# Patient Record
Sex: Female | Born: 1935 | Race: White | Hispanic: No | Smoking: Former smoker
Health system: Southern US, Community
[De-identification: ages and names within clinical notes are randomized; demographics above are authoritative.]

## PROBLEM LIST (undated history)

## (undated) DIAGNOSIS — I951 Orthostatic hypotension: Secondary | ICD-10-CM

## (undated) DIAGNOSIS — Z9889 Other specified postprocedural states: Secondary | ICD-10-CM

## (undated) DIAGNOSIS — Z860100 Personal history of colon polyps, unspecified: Secondary | ICD-10-CM

## (undated) DIAGNOSIS — M1990 Unspecified osteoarthritis, unspecified site: Secondary | ICD-10-CM

## (undated) DIAGNOSIS — F439 Reaction to severe stress, unspecified: Secondary | ICD-10-CM

## (undated) DIAGNOSIS — I340 Nonrheumatic mitral (valve) insufficiency: Secondary | ICD-10-CM

## (undated) DIAGNOSIS — H10439 Chronic follicular conjunctivitis, unspecified eye: Secondary | ICD-10-CM

## (undated) DIAGNOSIS — K648 Other hemorrhoids: Secondary | ICD-10-CM

## (undated) DIAGNOSIS — R9430 Abnormal result of cardiovascular function study, unspecified: Secondary | ICD-10-CM

## (undated) DIAGNOSIS — I6529 Occlusion and stenosis of unspecified carotid artery: Secondary | ICD-10-CM

## (undated) DIAGNOSIS — K225 Diverticulum of esophagus, acquired: Secondary | ICD-10-CM

## (undated) DIAGNOSIS — H906 Mixed conductive and sensorineural hearing loss, bilateral: Secondary | ICD-10-CM

## (undated) DIAGNOSIS — N289 Disorder of kidney and ureter, unspecified: Secondary | ICD-10-CM

## (undated) DIAGNOSIS — K5732 Diverticulitis of large intestine without perforation or abscess without bleeding: Secondary | ICD-10-CM

## (undated) DIAGNOSIS — C4490 Unspecified malignant neoplasm of skin, unspecified: Secondary | ICD-10-CM

## (undated) DIAGNOSIS — I2510 Atherosclerotic heart disease of native coronary artery without angina pectoris: Secondary | ICD-10-CM

## (undated) DIAGNOSIS — G459 Transient cerebral ischemic attack, unspecified: Secondary | ICD-10-CM

## (undated) DIAGNOSIS — I071 Rheumatic tricuspid insufficiency: Secondary | ICD-10-CM

## (undated) DIAGNOSIS — I4891 Unspecified atrial fibrillation: Secondary | ICD-10-CM

## (undated) DIAGNOSIS — IMO0002 Reserved for concepts with insufficient information to code with codable children: Secondary | ICD-10-CM

## (undated) DIAGNOSIS — I878 Other specified disorders of veins: Secondary | ICD-10-CM

## (undated) DIAGNOSIS — E8770 Fluid overload, unspecified: Secondary | ICD-10-CM

## (undated) DIAGNOSIS — T07XXXA Unspecified multiple injuries, initial encounter: Secondary | ICD-10-CM

## (undated) DIAGNOSIS — M79606 Pain in leg, unspecified: Secondary | ICD-10-CM

## (undated) DIAGNOSIS — I272 Pulmonary hypertension, unspecified: Secondary | ICD-10-CM

## (undated) DIAGNOSIS — J449 Chronic obstructive pulmonary disease, unspecified: Secondary | ICD-10-CM

## (undated) DIAGNOSIS — M797 Fibromyalgia: Secondary | ICD-10-CM

## (undated) DIAGNOSIS — Z7901 Long term (current) use of anticoagulants: Secondary | ICD-10-CM

## (undated) DIAGNOSIS — R490 Dysphonia: Secondary | ICD-10-CM

## (undated) DIAGNOSIS — L817 Pigmented purpuric dermatosis: Secondary | ICD-10-CM

## (undated) DIAGNOSIS — R42 Dizziness and giddiness: Secondary | ICD-10-CM

## (undated) DIAGNOSIS — M722 Plantar fascial fibromatosis: Secondary | ICD-10-CM

## (undated) HISTORY — PX: OTHER SURGICAL HISTORY: SHX169

## (undated) HISTORY — DX: Fibromyalgia: M79.7

## (undated) HISTORY — DX: Atherosclerotic heart disease of native coronary artery without angina pectoris: I25.10

## (undated) HISTORY — DX: Diverticulitis of large intestine without perforation or abscess without bleeding: K57.32

## (undated) HISTORY — DX: Nonrheumatic mitral (valve) insufficiency: I34.0

## (undated) HISTORY — DX: Plantar fascial fibromatosis: M72.2

## (undated) HISTORY — DX: Mixed conductive and sensorineural hearing loss, bilateral: H90.6

## (undated) HISTORY — DX: Transient cerebral ischemic attack, unspecified: G45.9

## (undated) HISTORY — PX: SHOULDER SURGERY: SHX246

## (undated) HISTORY — DX: Chronic follicular conjunctivitis, unspecified eye: H10.439

## (undated) HISTORY — DX: Dysphonia: R49.0

## (undated) HISTORY — DX: Unspecified multiple injuries, initial encounter: T07.XXXA

## (undated) HISTORY — PX: INTRAOCULAR LENS INSERTION: SHX110

## (undated) HISTORY — DX: Rheumatic tricuspid insufficiency: I07.1

## (undated) HISTORY — DX: Personal history of colonic polyps: Z86.010

## (undated) HISTORY — DX: Occlusion and stenosis of unspecified carotid artery: I65.29

## (undated) HISTORY — DX: Unspecified atrial fibrillation: I48.91

## (undated) HISTORY — DX: Long term (current) use of anticoagulants: Z79.01

## (undated) HISTORY — DX: Reaction to severe stress, unspecified: F43.9

## (undated) HISTORY — DX: Dizziness and giddiness: R42

## (undated) HISTORY — PX: INGUINAL HERNIA REPAIR: SHX194

## (undated) HISTORY — DX: Other hemorrhoids: K64.8

## (undated) HISTORY — DX: Other specified postprocedural states: Z98.890

## (undated) HISTORY — DX: Pain in leg, unspecified: M79.606

## (undated) HISTORY — DX: Pigmented purpuric dermatosis: L81.7

## (undated) HISTORY — DX: Orthostatic hypotension: I95.1

## (undated) HISTORY — PX: HEMORRHOID SURGERY: SHX153

## (undated) HISTORY — DX: Abnormal result of cardiovascular function study, unspecified: R94.30

## (undated) HISTORY — PX: FOOT SURGERY: SHX648

## (undated) HISTORY — DX: Personal history of colon polyps, unspecified: Z86.0100

## (undated) HISTORY — PX: TONSILLECTOMY: SUR1361

## (undated) HISTORY — DX: Reserved for concepts with insufficient information to code with codable children: IMO0002

## (undated) HISTORY — DX: Unspecified malignant neoplasm of skin, unspecified: C44.90

## (undated) HISTORY — DX: Diverticulum of esophagus, acquired: K22.5

## (undated) HISTORY — DX: Fluid overload, unspecified: E87.70

---

## 1998-03-09 ENCOUNTER — Ambulatory Visit (HOSPITAL_COMMUNITY): Admission: RE | Admit: 1998-03-09 | Discharge: 1998-03-09 | Payer: Self-pay | Admitting: Gastroenterology

## 1998-03-09 ENCOUNTER — Encounter: Payer: Self-pay | Admitting: Gastroenterology

## 1998-06-19 ENCOUNTER — Other Ambulatory Visit: Admission: RE | Admit: 1998-06-19 | Discharge: 1998-06-19 | Payer: Self-pay | Admitting: Obstetrics and Gynecology

## 1998-11-17 ENCOUNTER — Encounter: Payer: Self-pay | Admitting: Gastroenterology

## 1998-11-17 ENCOUNTER — Ambulatory Visit (HOSPITAL_COMMUNITY): Admission: RE | Admit: 1998-11-17 | Discharge: 1998-11-17 | Payer: Self-pay | Admitting: Gastroenterology

## 1999-03-02 ENCOUNTER — Ambulatory Visit (HOSPITAL_COMMUNITY): Admission: RE | Admit: 1999-03-02 | Discharge: 1999-03-02 | Payer: Self-pay | Admitting: Cardiology

## 1999-05-04 ENCOUNTER — Inpatient Hospital Stay (HOSPITAL_COMMUNITY): Admission: AD | Admit: 1999-05-04 | Discharge: 1999-05-08 | Payer: Self-pay | Admitting: Cardiology

## 1999-05-28 ENCOUNTER — Encounter: Admission: RE | Admit: 1999-05-28 | Discharge: 1999-05-28 | Payer: Self-pay | Admitting: Unknown Physician Specialty

## 1999-06-05 ENCOUNTER — Other Ambulatory Visit: Admission: RE | Admit: 1999-06-05 | Discharge: 1999-06-05 | Payer: Self-pay | Admitting: Obstetrics and Gynecology

## 1999-06-25 ENCOUNTER — Encounter: Admission: RE | Admit: 1999-06-25 | Discharge: 1999-06-25 | Payer: Self-pay | Admitting: Obstetrics and Gynecology

## 1999-06-25 ENCOUNTER — Encounter: Payer: Self-pay | Admitting: Obstetrics and Gynecology

## 2000-02-21 ENCOUNTER — Encounter: Payer: Self-pay | Admitting: Gastroenterology

## 2000-02-21 ENCOUNTER — Ambulatory Visit (HOSPITAL_COMMUNITY): Admission: RE | Admit: 2000-02-21 | Discharge: 2000-02-21 | Payer: Self-pay | Admitting: Gastroenterology

## 2000-06-26 ENCOUNTER — Encounter: Admission: RE | Admit: 2000-06-26 | Discharge: 2000-06-26 | Payer: Self-pay | Admitting: Obstetrics and Gynecology

## 2000-06-26 ENCOUNTER — Encounter: Payer: Self-pay | Admitting: Obstetrics and Gynecology

## 2001-02-13 ENCOUNTER — Other Ambulatory Visit: Admission: RE | Admit: 2001-02-13 | Discharge: 2001-02-13 | Payer: Self-pay | Admitting: Obstetrics and Gynecology

## 2001-06-12 ENCOUNTER — Ambulatory Visit (HOSPITAL_COMMUNITY): Admission: RE | Admit: 2001-06-12 | Discharge: 2001-06-12 | Payer: Self-pay

## 2002-01-04 ENCOUNTER — Encounter (INDEPENDENT_AMBULATORY_CARE_PROVIDER_SITE_OTHER): Payer: Self-pay | Admitting: Specialist

## 2002-01-04 ENCOUNTER — Encounter: Payer: Self-pay | Admitting: Gastroenterology

## 2002-01-04 ENCOUNTER — Encounter (INDEPENDENT_AMBULATORY_CARE_PROVIDER_SITE_OTHER): Payer: Self-pay | Admitting: *Deleted

## 2002-01-04 ENCOUNTER — Ambulatory Visit (HOSPITAL_COMMUNITY): Admission: RE | Admit: 2002-01-04 | Discharge: 2002-01-04 | Payer: Self-pay | Admitting: Gastroenterology

## 2002-03-18 ENCOUNTER — Encounter: Admission: RE | Admit: 2002-03-18 | Discharge: 2002-03-18 | Payer: Self-pay

## 2003-01-24 ENCOUNTER — Encounter: Payer: Self-pay | Admitting: Dentistry

## 2003-01-24 ENCOUNTER — Inpatient Hospital Stay (HOSPITAL_COMMUNITY): Admission: EM | Admit: 2003-01-24 | Discharge: 2003-01-26 | Payer: Self-pay | Admitting: Emergency Medicine

## 2003-01-25 ENCOUNTER — Encounter: Payer: Self-pay | Admitting: *Deleted

## 2003-03-07 ENCOUNTER — Ambulatory Visit (HOSPITAL_COMMUNITY): Admission: RE | Admit: 2003-03-07 | Discharge: 2003-03-07 | Payer: Self-pay | Admitting: Gastroenterology

## 2003-03-07 ENCOUNTER — Encounter: Payer: Self-pay | Admitting: Gastroenterology

## 2003-07-09 DIAGNOSIS — T07XXXA Unspecified multiple injuries, initial encounter: Secondary | ICD-10-CM

## 2003-07-09 HISTORY — DX: Unspecified multiple injuries, initial encounter: T07.XXXA

## 2003-08-17 ENCOUNTER — Emergency Department (HOSPITAL_COMMUNITY): Admission: EM | Admit: 2003-08-17 | Discharge: 2003-08-17 | Payer: Self-pay | Admitting: Family Medicine

## 2004-01-27 ENCOUNTER — Encounter: Admission: RE | Admit: 2004-01-27 | Discharge: 2004-01-27 | Payer: Self-pay | Admitting: Internal Medicine

## 2004-02-23 ENCOUNTER — Other Ambulatory Visit: Admission: RE | Admit: 2004-02-23 | Discharge: 2004-02-23 | Payer: Self-pay | Admitting: Obstetrics and Gynecology

## 2004-03-01 ENCOUNTER — Inpatient Hospital Stay (HOSPITAL_COMMUNITY): Admission: EM | Admit: 2004-03-01 | Discharge: 2004-03-04 | Payer: Self-pay | Admitting: Emergency Medicine

## 2004-05-23 ENCOUNTER — Ambulatory Visit: Payer: Self-pay | Admitting: *Deleted

## 2004-06-04 ENCOUNTER — Ambulatory Visit: Payer: Self-pay | Admitting: Internal Medicine

## 2004-06-25 ENCOUNTER — Ambulatory Visit: Payer: Self-pay | Admitting: Cardiovascular Disease

## 2004-07-03 ENCOUNTER — Ambulatory Visit: Payer: Self-pay

## 2004-07-24 ENCOUNTER — Ambulatory Visit: Payer: Self-pay

## 2004-08-01 ENCOUNTER — Ambulatory Visit: Payer: Self-pay | Admitting: Cardiology

## 2004-08-15 ENCOUNTER — Ambulatory Visit: Payer: Self-pay | Admitting: Internal Medicine

## 2004-09-05 ENCOUNTER — Ambulatory Visit: Payer: Self-pay | Admitting: Cardiology

## 2004-09-19 ENCOUNTER — Ambulatory Visit: Payer: Self-pay | Admitting: *Deleted

## 2004-10-01 ENCOUNTER — Ambulatory Visit: Payer: Self-pay | Admitting: Cardiology

## 2004-10-02 ENCOUNTER — Ambulatory Visit: Payer: Self-pay | Admitting: Cardiology

## 2004-10-17 ENCOUNTER — Ambulatory Visit: Payer: Self-pay | Admitting: Internal Medicine

## 2004-11-14 ENCOUNTER — Ambulatory Visit: Payer: Self-pay | Admitting: Cardiology

## 2004-11-26 ENCOUNTER — Emergency Department (HOSPITAL_COMMUNITY): Admission: EM | Admit: 2004-11-26 | Discharge: 2004-11-26 | Payer: Self-pay | Admitting: Emergency Medicine

## 2004-12-14 ENCOUNTER — Ambulatory Visit (HOSPITAL_COMMUNITY): Admission: RE | Admit: 2004-12-14 | Discharge: 2004-12-14 | Payer: Self-pay | Admitting: Specialist

## 2004-12-21 ENCOUNTER — Ambulatory Visit: Payer: Self-pay | Admitting: Cardiology

## 2005-01-10 ENCOUNTER — Ambulatory Visit: Payer: Self-pay | Admitting: Internal Medicine

## 2005-02-06 ENCOUNTER — Ambulatory Visit: Payer: Self-pay | Admitting: *Deleted

## 2005-03-07 ENCOUNTER — Ambulatory Visit: Payer: Self-pay | Admitting: Internal Medicine

## 2005-03-22 ENCOUNTER — Ambulatory Visit: Payer: Self-pay | Admitting: Internal Medicine

## 2005-04-03 ENCOUNTER — Ambulatory Visit: Payer: Self-pay | Admitting: Cardiology

## 2005-04-25 ENCOUNTER — Ambulatory Visit: Payer: Self-pay | Admitting: Cardiology

## 2005-05-01 ENCOUNTER — Ambulatory Visit: Payer: Self-pay | Admitting: Cardiology

## 2005-05-07 ENCOUNTER — Ambulatory Visit: Payer: Self-pay | Admitting: Cardiology

## 2005-05-21 ENCOUNTER — Ambulatory Visit: Payer: Self-pay | Admitting: Internal Medicine

## 2005-06-05 ENCOUNTER — Ambulatory Visit: Payer: Self-pay | Admitting: Cardiology

## 2005-06-19 ENCOUNTER — Ambulatory Visit: Payer: Self-pay | Admitting: Cardiology

## 2005-07-10 ENCOUNTER — Ambulatory Visit: Payer: Self-pay | Admitting: Cardiology

## 2005-08-07 ENCOUNTER — Ambulatory Visit: Payer: Self-pay | Admitting: Cardiology

## 2005-08-28 ENCOUNTER — Ambulatory Visit: Payer: Self-pay | Admitting: Cardiology

## 2005-09-24 ENCOUNTER — Encounter: Payer: Self-pay | Admitting: Internal Medicine

## 2005-09-24 ENCOUNTER — Ambulatory Visit: Payer: Self-pay | Admitting: Cardiology

## 2005-09-24 ENCOUNTER — Ambulatory Visit: Payer: Self-pay | Admitting: Internal Medicine

## 2005-10-22 ENCOUNTER — Ambulatory Visit: Payer: Self-pay | Admitting: Cardiology

## 2005-11-06 ENCOUNTER — Ambulatory Visit: Payer: Self-pay | Admitting: Cardiology

## 2005-11-20 ENCOUNTER — Ambulatory Visit: Payer: Self-pay | Admitting: Cardiology

## 2005-12-18 ENCOUNTER — Ambulatory Visit: Payer: Self-pay | Admitting: Cardiovascular Disease

## 2006-01-13 ENCOUNTER — Ambulatory Visit: Payer: Self-pay | Admitting: Internal Medicine

## 2006-01-13 ENCOUNTER — Ambulatory Visit: Payer: Self-pay

## 2006-01-29 ENCOUNTER — Ambulatory Visit: Payer: Self-pay | Admitting: Cardiology

## 2006-02-25 ENCOUNTER — Ambulatory Visit: Payer: Self-pay | Admitting: Cardiology

## 2006-03-26 ENCOUNTER — Ambulatory Visit: Payer: Self-pay | Admitting: Cardiology

## 2006-03-28 ENCOUNTER — Encounter: Payer: Self-pay | Admitting: Internal Medicine

## 2006-04-10 ENCOUNTER — Ambulatory Visit: Payer: Self-pay | Admitting: Cardiology

## 2006-04-23 ENCOUNTER — Ambulatory Visit: Payer: Self-pay | Admitting: *Deleted

## 2006-04-23 ENCOUNTER — Ambulatory Visit: Payer: Self-pay | Admitting: Cardiology

## 2006-05-07 ENCOUNTER — Ambulatory Visit: Payer: Self-pay | Admitting: Cardiology

## 2006-05-22 ENCOUNTER — Ambulatory Visit: Payer: Self-pay | Admitting: Cardiology

## 2006-05-28 ENCOUNTER — Encounter: Payer: Self-pay | Admitting: Internal Medicine

## 2006-06-11 ENCOUNTER — Ambulatory Visit: Payer: Self-pay | Admitting: Cardiology

## 2006-07-09 ENCOUNTER — Ambulatory Visit: Payer: Self-pay | Admitting: *Deleted

## 2006-07-28 ENCOUNTER — Encounter: Payer: Self-pay | Admitting: Internal Medicine

## 2006-07-30 ENCOUNTER — Ambulatory Visit: Payer: Self-pay | Admitting: Cardiology

## 2006-08-15 ENCOUNTER — Ambulatory Visit: Payer: Self-pay | Admitting: *Deleted

## 2006-08-15 ENCOUNTER — Other Ambulatory Visit: Admission: RE | Admit: 2006-08-15 | Discharge: 2006-08-15 | Payer: Self-pay | Admitting: Obstetrics & Gynecology

## 2006-09-03 ENCOUNTER — Encounter: Admission: RE | Admit: 2006-09-03 | Discharge: 2006-09-03 | Payer: Self-pay | Admitting: Obstetrics & Gynecology

## 2006-09-03 ENCOUNTER — Ambulatory Visit: Payer: Self-pay | Admitting: Internal Medicine

## 2006-10-01 ENCOUNTER — Ambulatory Visit: Payer: Self-pay | Admitting: Cardiovascular Disease

## 2006-10-23 ENCOUNTER — Ambulatory Visit: Payer: Self-pay | Admitting: Internal Medicine

## 2006-10-23 LAB — CONVERTED CEMR LAB
ALT: 15 units/L (ref 0–40)
AST: 22 units/L (ref 0–37)
Alkaline Phosphatase: 48 units/L (ref 39–117)
BUN: 17 mg/dL (ref 6–23)
Basophils Relative: 0 % (ref 0.0–1.0)
Calcium: 8.9 mg/dL (ref 8.4–10.5)
Chloride: 104 meq/L (ref 96–112)
Cholesterol: 186 mg/dL (ref 0–200)
Creatinine, Ser: 1.1 mg/dL (ref 0.4–1.2)
Eosinophils Absolute: 0.1 10*3/uL (ref 0.0–0.6)
GFR calc Af Amer: 61 mL/min
HDL: 57.7 mg/dL (ref >39.0)
Lymphocytes Relative: 24.1 % (ref 12.0–46.0)
MCV: 94.9 fL (ref 78.0–100.0)
Monocytes Absolute: 0.5 10*3/uL (ref 0.2–0.7)
Potassium: 4.2 meq/L (ref 3.5–5.1)
RBC: 4.81 M/uL (ref 3.87–5.11)
RDW: 12.9 % (ref 11.5–14.6)
Total Bilirubin: 1 mg/dL (ref 0.3–1.2)
Total CHOL/HDL Ratio: 3.2
Triglycerides: 116 mg/dL (ref 0–149)
WBC: 5 10*3/uL (ref 4.5–10.5)

## 2006-11-04 ENCOUNTER — Ambulatory Visit: Payer: Self-pay | Admitting: Cardiology

## 2006-11-10 ENCOUNTER — Encounter: Payer: Self-pay | Admitting: Internal Medicine

## 2006-11-27 ENCOUNTER — Ambulatory Visit: Payer: Self-pay | Admitting: Cardiology

## 2006-12-05 ENCOUNTER — Ambulatory Visit: Payer: Self-pay | Admitting: Cardiology

## 2006-12-05 ENCOUNTER — Ambulatory Visit: Payer: Self-pay | Admitting: Internal Medicine

## 2006-12-05 LAB — CONVERTED CEMR LAB
Basophils Absolute: 0 10*3/uL (ref 0.0–0.1)
Basophils Relative: 0.4 % (ref 0.0–1.0)
Eosinophils Absolute: 0.1 10*3/uL (ref 0.0–0.6)
Eosinophils Relative: 2 % (ref 0.0–5.0)
Hemoglobin: 15.8 g/dL — ABNORMAL HIGH (ref 12.0–15.0)
MCHC: 34.4 g/dL (ref 30.0–36.0)
Monocytes Absolute: 0.5 10*3/uL (ref 0.2–0.7)
Monocytes Relative: 10.1 % (ref 3.0–11.0)
Neutro Abs: 2.7 10*3/uL (ref 1.4–7.7)
Neutrophils Relative %: 60.1 % (ref 43.0–77.0)

## 2007-01-08 ENCOUNTER — Ambulatory Visit: Payer: Self-pay | Admitting: Internal Medicine

## 2007-01-08 ENCOUNTER — Ambulatory Visit: Payer: Self-pay | Admitting: Cardiology

## 2007-02-04 ENCOUNTER — Ambulatory Visit: Payer: Self-pay | Admitting: Cardiology

## 2007-03-04 ENCOUNTER — Ambulatory Visit: Payer: Self-pay | Admitting: Cardiology

## 2007-04-01 ENCOUNTER — Ambulatory Visit: Payer: Self-pay | Admitting: Internal Medicine

## 2007-04-15 ENCOUNTER — Ambulatory Visit: Payer: Self-pay | Admitting: Internal Medicine

## 2007-05-06 ENCOUNTER — Ambulatory Visit: Payer: Self-pay | Admitting: Cardiology

## 2007-06-08 ENCOUNTER — Encounter: Payer: Self-pay | Admitting: Internal Medicine

## 2007-06-10 ENCOUNTER — Ambulatory Visit: Payer: Self-pay | Admitting: Internal Medicine

## 2007-06-29 ENCOUNTER — Ambulatory Visit: Payer: Self-pay | Admitting: Cardiology

## 2007-07-29 ENCOUNTER — Ambulatory Visit: Payer: Self-pay | Admitting: Cardiovascular Disease

## 2007-07-29 ENCOUNTER — Ambulatory Visit: Payer: Self-pay | Admitting: Cardiology

## 2007-08-26 ENCOUNTER — Ambulatory Visit: Payer: Self-pay | Admitting: Internal Medicine

## 2007-09-11 ENCOUNTER — Ambulatory Visit: Payer: Self-pay | Admitting: Internal Medicine

## 2007-09-11 DIAGNOSIS — J309 Allergic rhinitis, unspecified: Secondary | ICD-10-CM | POA: Insufficient documentation

## 2007-09-11 DIAGNOSIS — H10439 Chronic follicular conjunctivitis, unspecified eye: Secondary | ICD-10-CM

## 2007-09-11 DIAGNOSIS — H906 Mixed conductive and sensorineural hearing loss, bilateral: Secondary | ICD-10-CM | POA: Insufficient documentation

## 2007-09-11 DIAGNOSIS — M722 Plantar fascial fibromatosis: Secondary | ICD-10-CM

## 2007-09-11 DIAGNOSIS — IMO0001 Reserved for inherently not codable concepts without codable children: Secondary | ICD-10-CM

## 2007-09-11 LAB — CONVERTED CEMR LAB
Albumin: 3.9 g/dL (ref 3.5–5.2)
BUN: 28 mg/dL — ABNORMAL HIGH (ref 6–23)
Calcium: 9.2 mg/dL (ref 8.4–10.5)
Creatinine, Ser: 1.1 mg/dL (ref 0.4–1.2)
GFR calc Af Amer: 61 mL/min
GFR calc non Af Amer: 50 mL/min
Glucose, Bld: 94 mg/dL (ref 70–99)
Potassium: 4.5 meq/L (ref 3.5–5.1)
Sodium: 145 meq/L (ref 135–145)

## 2007-09-13 ENCOUNTER — Encounter: Payer: Self-pay | Admitting: Internal Medicine

## 2007-09-16 ENCOUNTER — Ambulatory Visit: Payer: Self-pay | Admitting: Cardiology

## 2007-09-17 ENCOUNTER — Encounter: Payer: Self-pay | Admitting: *Deleted

## 2007-09-17 DIAGNOSIS — Z85828 Personal history of other malignant neoplasm of skin: Secondary | ICD-10-CM

## 2007-09-17 DIAGNOSIS — D126 Benign neoplasm of colon, unspecified: Secondary | ICD-10-CM

## 2007-09-17 DIAGNOSIS — Z9889 Other specified postprocedural states: Secondary | ICD-10-CM

## 2007-09-17 DIAGNOSIS — Z9849 Cataract extraction status, unspecified eye: Secondary | ICD-10-CM

## 2007-09-17 DIAGNOSIS — Z9189 Other specified personal risk factors, not elsewhere classified: Secondary | ICD-10-CM | POA: Insufficient documentation

## 2007-09-17 DIAGNOSIS — Z961 Presence of intraocular lens: Secondary | ICD-10-CM

## 2007-09-17 DIAGNOSIS — K5730 Diverticulosis of large intestine without perforation or abscess without bleeding: Secondary | ICD-10-CM | POA: Insufficient documentation

## 2007-09-17 DIAGNOSIS — Z9089 Acquired absence of other organs: Secondary | ICD-10-CM

## 2007-09-25 ENCOUNTER — Telehealth: Payer: Self-pay | Admitting: Internal Medicine

## 2007-10-14 ENCOUNTER — Ambulatory Visit: Payer: Self-pay | Admitting: Cardiovascular Disease

## 2007-10-15 ENCOUNTER — Telehealth: Payer: Self-pay | Admitting: Internal Medicine

## 2007-11-11 ENCOUNTER — Ambulatory Visit: Payer: Self-pay | Admitting: Internal Medicine

## 2007-11-26 ENCOUNTER — Encounter: Payer: Self-pay | Admitting: Internal Medicine

## 2007-12-09 ENCOUNTER — Ambulatory Visit: Payer: Self-pay | Admitting: Cardiology

## 2008-01-06 ENCOUNTER — Ambulatory Visit: Payer: Self-pay | Admitting: Internal Medicine

## 2008-02-03 ENCOUNTER — Ambulatory Visit: Payer: Self-pay | Admitting: Cardiology

## 2008-02-24 ENCOUNTER — Ambulatory Visit: Payer: Self-pay

## 2008-02-24 ENCOUNTER — Ambulatory Visit: Payer: Self-pay | Admitting: Cardiology

## 2008-03-16 ENCOUNTER — Ambulatory Visit: Payer: Self-pay | Admitting: Internal Medicine

## 2008-03-30 ENCOUNTER — Ambulatory Visit: Payer: Self-pay | Admitting: Cardiology

## 2008-04-06 ENCOUNTER — Ambulatory Visit: Payer: Self-pay | Admitting: Cardiology

## 2008-04-11 ENCOUNTER — Telehealth: Payer: Self-pay | Admitting: Gastroenterology

## 2008-04-20 ENCOUNTER — Ambulatory Visit: Payer: Self-pay | Admitting: Cardiology

## 2008-05-12 ENCOUNTER — Telehealth: Payer: Self-pay | Admitting: Internal Medicine

## 2008-05-18 ENCOUNTER — Ambulatory Visit: Payer: Self-pay | Admitting: Internal Medicine

## 2008-05-19 ENCOUNTER — Ambulatory Visit: Payer: Self-pay | Admitting: Cardiology

## 2008-05-23 ENCOUNTER — Ambulatory Visit (HOSPITAL_COMMUNITY): Admission: RE | Admit: 2008-05-23 | Discharge: 2008-05-23 | Payer: Self-pay | Admitting: Internal Medicine

## 2008-05-27 ENCOUNTER — Ambulatory Visit: Payer: Self-pay | Admitting: Cardiovascular Disease

## 2008-06-16 ENCOUNTER — Other Ambulatory Visit: Admission: RE | Admit: 2008-06-16 | Discharge: 2008-06-16 | Payer: Self-pay | Admitting: Obstetrics and Gynecology

## 2008-06-16 ENCOUNTER — Ambulatory Visit: Payer: Self-pay | Admitting: Cardiology

## 2008-07-04 ENCOUNTER — Ambulatory Visit (HOSPITAL_COMMUNITY): Admission: RE | Admit: 2008-07-04 | Discharge: 2008-07-04 | Payer: Self-pay | Admitting: Internal Medicine

## 2008-07-06 ENCOUNTER — Ambulatory Visit: Payer: Self-pay | Admitting: Internal Medicine

## 2008-08-10 ENCOUNTER — Ambulatory Visit: Payer: Self-pay | Admitting: Cardiology

## 2008-09-14 ENCOUNTER — Ambulatory Visit: Payer: Self-pay | Admitting: Cardiovascular Disease

## 2008-10-05 ENCOUNTER — Encounter: Payer: Self-pay | Admitting: Internal Medicine

## 2008-10-19 ENCOUNTER — Ambulatory Visit: Payer: Self-pay | Admitting: Internal Medicine

## 2008-11-16 ENCOUNTER — Ambulatory Visit: Payer: Self-pay | Admitting: Cardiology

## 2008-12-06 ENCOUNTER — Encounter: Payer: Self-pay | Admitting: *Deleted

## 2008-12-09 ENCOUNTER — Telehealth (INDEPENDENT_AMBULATORY_CARE_PROVIDER_SITE_OTHER): Payer: Self-pay | Admitting: *Deleted

## 2008-12-14 ENCOUNTER — Ambulatory Visit: Payer: Self-pay | Admitting: Cardiology

## 2008-12-14 LAB — CONVERTED CEMR LAB
POC INR: 2.1
Protime: 17.8

## 2009-01-11 ENCOUNTER — Ambulatory Visit: Payer: Self-pay | Admitting: Internal Medicine

## 2009-01-11 ENCOUNTER — Encounter: Payer: Self-pay | Admitting: *Deleted

## 2009-01-11 ENCOUNTER — Encounter (INDEPENDENT_AMBULATORY_CARE_PROVIDER_SITE_OTHER): Payer: Self-pay | Admitting: Cardiology

## 2009-02-08 ENCOUNTER — Ambulatory Visit: Payer: Self-pay | Admitting: Cardiology

## 2009-02-08 LAB — CONVERTED CEMR LAB
POC INR: 2.7
Prothrombin Time: 19.9 s

## 2009-03-08 ENCOUNTER — Ambulatory Visit: Payer: Self-pay | Admitting: Internal Medicine

## 2009-03-10 ENCOUNTER — Telehealth: Payer: Self-pay | Admitting: Cardiology

## 2009-03-14 ENCOUNTER — Telehealth (INDEPENDENT_AMBULATORY_CARE_PROVIDER_SITE_OTHER): Payer: Self-pay | Admitting: *Deleted

## 2009-03-29 ENCOUNTER — Encounter: Payer: Self-pay | Admitting: Internal Medicine

## 2009-04-05 ENCOUNTER — Ambulatory Visit: Payer: Self-pay | Admitting: Cardiovascular Disease

## 2009-04-05 LAB — CONVERTED CEMR LAB: POC INR: 2.8

## 2009-04-14 ENCOUNTER — Encounter: Payer: Self-pay | Admitting: Cardiology

## 2009-04-28 ENCOUNTER — Telehealth: Payer: Self-pay | Admitting: Internal Medicine

## 2009-04-29 ENCOUNTER — Encounter: Payer: Self-pay | Admitting: Cardiology

## 2009-04-29 DIAGNOSIS — Z8679 Personal history of other diseases of the circulatory system: Secondary | ICD-10-CM | POA: Insufficient documentation

## 2009-05-01 ENCOUNTER — Ambulatory Visit: Payer: Self-pay | Admitting: Cardiovascular Disease

## 2009-05-01 ENCOUNTER — Ambulatory Visit: Payer: Self-pay | Admitting: Cardiology

## 2009-05-05 ENCOUNTER — Ambulatory Visit: Payer: Self-pay | Admitting: Internal Medicine

## 2009-05-05 LAB — CONVERTED CEMR LAB
Basophils Absolute: 0.1 10*3/uL (ref 0.0–0.1)
Basophils Relative: 1.5 % (ref 0.0–3.0)
Calcium: 9.1 mg/dL (ref 8.4–10.5)
Eosinophils Absolute: 0.1 10*3/uL (ref 0.0–0.7)
Eosinophils Relative: 1.6 % (ref 0.0–5.0)
GFR calc non Af Amer: 55.82 mL/min (ref >60)
Glucose, Bld: 97 mg/dL (ref 70–99)
HCT: 50.7 % — ABNORMAL HIGH (ref 36.0–46.0)
Hemoglobin: 17.1 g/dL — ABNORMAL HIGH (ref 12.0–15.0)
Lymphocytes Relative: 18.3 % (ref 12.0–46.0)
Lymphs Abs: 1.3 10*3/uL (ref 0.7–4.0)
MCHC: 33.6 g/dL (ref 30.0–36.0)
MCV: 99.2 fL (ref 78.0–100.0)
Neutro Abs: 5.3 10*3/uL (ref 1.4–7.7)
Neutrophils Relative %: 73 % (ref 43.0–77.0)
RDW: 13.2 % (ref 11.5–14.6)
Sodium: 145 meq/L (ref 135–145)
TSH: 1.5 microintl units/mL (ref 0.35–5.50)
WBC: 7.2 10*3/uL (ref 4.5–10.5)

## 2009-05-31 ENCOUNTER — Ambulatory Visit: Payer: Self-pay | Admitting: Internal Medicine

## 2009-05-31 LAB — CONVERTED CEMR LAB: POC INR: 2.7

## 2009-06-02 ENCOUNTER — Telehealth: Payer: Self-pay | Admitting: Cardiology

## 2009-06-17 ENCOUNTER — Telehealth (INDEPENDENT_AMBULATORY_CARE_PROVIDER_SITE_OTHER): Payer: Self-pay | Admitting: *Deleted

## 2009-06-19 ENCOUNTER — Ambulatory Visit: Payer: Self-pay | Admitting: Internal Medicine

## 2009-06-28 ENCOUNTER — Encounter: Admission: RE | Admit: 2009-06-28 | Discharge: 2009-06-28 | Payer: Self-pay | Admitting: Specialist

## 2009-07-05 ENCOUNTER — Encounter (INDEPENDENT_AMBULATORY_CARE_PROVIDER_SITE_OTHER): Payer: Self-pay | Admitting: Cardiology

## 2009-07-05 ENCOUNTER — Ambulatory Visit: Payer: Self-pay | Admitting: Cardiology

## 2009-07-05 ENCOUNTER — Encounter: Payer: Self-pay | Admitting: Internal Medicine

## 2009-07-05 LAB — CONVERTED CEMR LAB: POC INR: 1.4

## 2009-07-06 ENCOUNTER — Encounter: Admission: RE | Admit: 2009-07-06 | Discharge: 2009-07-06 | Payer: Self-pay | Admitting: Specialist

## 2009-07-10 ENCOUNTER — Encounter: Admission: RE | Admit: 2009-07-10 | Discharge: 2009-07-10 | Payer: Self-pay | Admitting: Specialist

## 2009-07-11 LAB — CONVERTED CEMR LAB: Sed Rate: 5 mm/hr (ref 0–22)

## 2009-07-14 ENCOUNTER — Ambulatory Visit: Payer: Self-pay | Admitting: Cardiology

## 2009-07-17 ENCOUNTER — Encounter: Admission: RE | Admit: 2009-07-17 | Discharge: 2009-07-17 | Payer: Self-pay | Admitting: Specialist

## 2009-07-17 ENCOUNTER — Ambulatory Visit: Payer: Self-pay | Admitting: Internal Medicine

## 2009-07-17 LAB — CONVERTED CEMR LAB: POC INR: 1

## 2009-07-25 ENCOUNTER — Telehealth: Payer: Self-pay | Admitting: Cardiology

## 2009-07-26 ENCOUNTER — Encounter: Admission: RE | Admit: 2009-07-26 | Discharge: 2009-09-14 | Payer: Self-pay | Admitting: Specialist

## 2009-08-01 ENCOUNTER — Ambulatory Visit: Payer: Self-pay | Admitting: Cardiovascular Disease

## 2009-08-01 LAB — CONVERTED CEMR LAB: POC INR: 1.3

## 2009-08-02 ENCOUNTER — Encounter: Admission: RE | Admit: 2009-08-02 | Discharge: 2009-08-02 | Payer: Self-pay | Admitting: Specialist

## 2009-08-08 ENCOUNTER — Encounter: Payer: Self-pay | Admitting: Internal Medicine

## 2009-08-09 ENCOUNTER — Ambulatory Visit: Payer: Self-pay | Admitting: Internal Medicine

## 2009-08-23 ENCOUNTER — Ambulatory Visit: Payer: Self-pay | Admitting: Internal Medicine

## 2009-09-06 ENCOUNTER — Encounter: Payer: Self-pay | Admitting: Cardiology

## 2009-09-06 ENCOUNTER — Ambulatory Visit: Payer: Self-pay | Admitting: Cardiology

## 2009-09-06 LAB — CONVERTED CEMR LAB: POC INR: 2.8

## 2009-09-27 ENCOUNTER — Ambulatory Visit: Payer: Self-pay | Admitting: Internal Medicine

## 2009-09-27 ENCOUNTER — Encounter (INDEPENDENT_AMBULATORY_CARE_PROVIDER_SITE_OTHER): Payer: Self-pay | Admitting: Cardiology

## 2009-09-27 LAB — CONVERTED CEMR LAB: POC INR: 3.1

## 2009-10-10 ENCOUNTER — Ambulatory Visit: Payer: Self-pay | Admitting: Diagnostic Radiology

## 2009-10-10 ENCOUNTER — Emergency Department (HOSPITAL_BASED_OUTPATIENT_CLINIC_OR_DEPARTMENT_OTHER): Admission: EM | Admit: 2009-10-10 | Discharge: 2009-10-11 | Payer: Self-pay | Admitting: Emergency Medicine

## 2009-10-11 ENCOUNTER — Telehealth (INDEPENDENT_AMBULATORY_CARE_PROVIDER_SITE_OTHER): Payer: Self-pay | Admitting: *Deleted

## 2009-10-17 ENCOUNTER — Emergency Department (HOSPITAL_BASED_OUTPATIENT_CLINIC_OR_DEPARTMENT_OTHER): Admission: EM | Admit: 2009-10-17 | Discharge: 2009-10-17 | Payer: Self-pay | Admitting: Emergency Medicine

## 2009-10-25 ENCOUNTER — Ambulatory Visit: Payer: Self-pay | Admitting: Cardiology

## 2009-10-25 LAB — CONVERTED CEMR LAB: POC INR: 2.5

## 2009-11-08 ENCOUNTER — Encounter: Payer: Self-pay | Admitting: Internal Medicine

## 2009-11-13 ENCOUNTER — Ambulatory Visit: Payer: Self-pay | Admitting: Cardiology

## 2009-11-13 LAB — CONVERTED CEMR LAB: POC INR: 2.1

## 2009-11-17 ENCOUNTER — Telehealth: Payer: Self-pay | Admitting: Cardiology

## 2009-11-17 LAB — CONVERTED CEMR LAB
Calcium: 9 mg/dL (ref 8.4–10.5)
Eosinophils Absolute: 0.1 10*3/uL (ref 0.0–0.7)
Eosinophils Relative: 2.4 % (ref 0.0–5.0)
GFR calc non Af Amer: 59.87 mL/min (ref >60)
Glucose, Bld: 108 mg/dL — ABNORMAL HIGH (ref 70–99)
HCT: 44 % (ref 36.0–46.0)
Hemoglobin: 14.9 g/dL (ref 12.0–15.0)
Lymphocytes Relative: 21.9 % (ref 12.0–46.0)
Monocytes Absolute: 0.6 10*3/uL (ref 0.1–1.0)
Monocytes Relative: 9.6 % (ref 3.0–12.0)
Potassium: 4.9 meq/L (ref 3.5–5.1)
RBC: 4.5 M/uL (ref 3.87–5.11)
RDW: 14.3 % (ref 11.5–14.6)
Sodium: 145 meq/L (ref 135–145)
TSH: 0.89 microintl units/mL (ref 0.35–5.50)
WBC: 5.8 10*3/uL (ref 4.5–10.5)

## 2009-12-01 ENCOUNTER — Ambulatory Visit: Payer: Self-pay | Admitting: Cardiology

## 2009-12-07 LAB — CONVERTED CEMR LAB
Calcium: 9.6 mg/dL (ref 8.4–10.5)
GFR calc non Af Amer: 46.97 mL/min (ref >60)
Glucose, Bld: 82 mg/dL (ref 70–99)
Sodium: 145 meq/L (ref 135–145)

## 2009-12-21 ENCOUNTER — Ambulatory Visit: Payer: Self-pay | Admitting: Cardiology

## 2010-01-09 ENCOUNTER — Telehealth: Payer: Self-pay | Admitting: Cardiology

## 2010-01-17 ENCOUNTER — Ambulatory Visit: Payer: Self-pay | Admitting: Internal Medicine

## 2010-01-17 LAB — CONVERTED CEMR LAB: POC INR: 1.3

## 2010-01-31 ENCOUNTER — Ambulatory Visit: Payer: Self-pay

## 2010-02-13 ENCOUNTER — Encounter: Payer: Self-pay | Admitting: Cardiology

## 2010-02-14 ENCOUNTER — Ambulatory Visit: Payer: Self-pay | Admitting: Cardiology

## 2010-02-14 ENCOUNTER — Encounter: Payer: Self-pay | Admitting: Cardiovascular Disease

## 2010-02-28 ENCOUNTER — Ambulatory Visit: Payer: Self-pay | Admitting: Internal Medicine

## 2010-02-28 LAB — CONVERTED CEMR LAB: POC INR: 2.6

## 2010-03-09 ENCOUNTER — Telehealth: Payer: Self-pay | Admitting: Cardiology

## 2010-03-15 ENCOUNTER — Telehealth (INDEPENDENT_AMBULATORY_CARE_PROVIDER_SITE_OTHER): Payer: Self-pay | Admitting: *Deleted

## 2010-03-21 ENCOUNTER — Ambulatory Visit: Payer: Self-pay | Admitting: Internal Medicine

## 2010-03-21 LAB — CONVERTED CEMR LAB: POC INR: 1.2

## 2010-03-24 ENCOUNTER — Inpatient Hospital Stay (HOSPITAL_COMMUNITY)
Admission: EM | Admit: 2010-03-24 | Discharge: 2010-03-26 | Payer: Self-pay | Source: Home / Self Care | Admitting: Emergency Medicine

## 2010-03-24 ENCOUNTER — Telehealth: Payer: Self-pay | Admitting: Adult Health

## 2010-03-24 ENCOUNTER — Ambulatory Visit: Payer: Self-pay | Admitting: Internal Medicine

## 2010-03-24 ENCOUNTER — Ambulatory Visit: Payer: Self-pay | Admitting: Cardiology

## 2010-03-25 ENCOUNTER — Encounter (INDEPENDENT_AMBULATORY_CARE_PROVIDER_SITE_OTHER): Payer: Self-pay | Admitting: Internal Medicine

## 2010-03-29 ENCOUNTER — Ambulatory Visit: Payer: Self-pay | Admitting: Cardiology

## 2010-04-05 ENCOUNTER — Ambulatory Visit: Payer: Self-pay | Admitting: Internal Medicine

## 2010-04-05 ENCOUNTER — Ambulatory Visit: Payer: Self-pay | Admitting: Cardiology

## 2010-04-05 LAB — CONVERTED CEMR LAB: POC INR: 2.5

## 2010-04-18 ENCOUNTER — Ambulatory Visit: Payer: Self-pay | Admitting: Cardiology

## 2010-04-18 LAB — CONVERTED CEMR LAB: POC INR: 2.3

## 2010-04-19 ENCOUNTER — Telehealth: Payer: Self-pay | Admitting: Internal Medicine

## 2010-04-23 ENCOUNTER — Telehealth: Payer: Self-pay | Admitting: Internal Medicine

## 2010-05-14 ENCOUNTER — Telehealth: Payer: Self-pay | Admitting: Cardiology

## 2010-05-16 ENCOUNTER — Ambulatory Visit: Payer: Self-pay | Admitting: Cardiovascular Disease

## 2010-05-16 LAB — CONVERTED CEMR LAB: POC INR: 2.1

## 2010-05-24 ENCOUNTER — Encounter: Payer: Self-pay | Admitting: Cardiology

## 2010-05-25 ENCOUNTER — Ambulatory Visit: Payer: Self-pay | Admitting: Cardiology

## 2010-05-28 ENCOUNTER — Ambulatory Visit (HOSPITAL_COMMUNITY): Admission: RE | Admit: 2010-05-28 | Discharge: 2010-05-28 | Payer: Self-pay | Admitting: Internal Medicine

## 2010-06-13 ENCOUNTER — Ambulatory Visit: Payer: Self-pay

## 2010-06-18 ENCOUNTER — Ambulatory Visit: Payer: Self-pay | Admitting: Internal Medicine

## 2010-06-18 ENCOUNTER — Telehealth: Payer: Self-pay | Admitting: Internal Medicine

## 2010-06-20 ENCOUNTER — Ambulatory Visit: Payer: Self-pay | Admitting: Cardiology

## 2010-06-20 LAB — CONVERTED CEMR LAB: POC INR: 2.7

## 2010-07-18 ENCOUNTER — Ambulatory Visit: Admit: 2010-07-18 | Payer: Self-pay

## 2010-07-25 ENCOUNTER — Ambulatory Visit
Admission: RE | Admit: 2010-07-25 | Discharge: 2010-07-25 | Payer: Self-pay | Source: Home / Self Care | Attending: Internal Medicine | Admitting: Internal Medicine

## 2010-07-25 LAB — CONVERTED CEMR LAB: POC INR: 2.5

## 2010-07-26 ENCOUNTER — Ambulatory Visit: Admit: 2010-07-26 | Payer: Self-pay

## 2010-08-02 ENCOUNTER — Encounter: Payer: Self-pay | Admitting: Internal Medicine

## 2010-08-03 ENCOUNTER — Telehealth: Payer: Self-pay | Admitting: Cardiology

## 2010-08-07 ENCOUNTER — Ambulatory Visit
Admission: RE | Admit: 2010-08-07 | Discharge: 2010-08-07 | Payer: Self-pay | Source: Home / Self Care | Attending: Internal Medicine | Admitting: Internal Medicine

## 2010-08-07 DIAGNOSIS — M51379 Other intervertebral disc degeneration, lumbosacral region without mention of lumbar back pain or lower extremity pain: Secondary | ICD-10-CM | POA: Insufficient documentation

## 2010-08-07 DIAGNOSIS — M5137 Other intervertebral disc degeneration, lumbosacral region: Secondary | ICD-10-CM | POA: Insufficient documentation

## 2010-08-07 NOTE — Medication Information (Signed)
Summary: rov  Anticoagulant Therapy  Managed by: Weston Brass, PharmD Referring MD: Willa Rough MD PCP: Illene Regulus, MD Supervising MD: Riley Kill MD, Maisie Fus Indication 1: Atrial Fibrillation (ICD-427.31) Lab Used: [facility] Site: [facility] INR POC 2.1 INR RANGE 2 - 3  Dietary changes: no    Health status changes: no    Bleeding/hemorrhagic complications: no    Recent/future hospitalizations: no    Any changes in medication regimen? no    Recent/future dental: no  Any missed doses?: no       Is patient compliant with meds? yes       Allergies: No Known Drug Allergies  Anticoagulation Management History:      The patient is taking warfarin and comes in today for a routine follow up visit.  Positive risk factors for bleeding include an age of 37 years or older.  The bleeding index is 'intermediate risk'.  Positive CHADS2 values include Age > 42 years old.  The start date was 03/20/1998.  Anticoagulation responsible provider: Riley Kill MD, Maisie Fus.  INR POC: 2.1.  Cuvette Lot#: [id number].  Exp: 11/2010.    Anticoagulation Management Assessment/Plan:      The patient's current anticoagulation dose is Coumadin 5 mg  tabs: Take as directed by coumadin clinic..  The target INR is 2 - 3.  The next INR is due 12/13/2009.  Anticoagulation instructions were given to patient.  Results were reviewed/authorized by Weston Brass, PharmD.  She was notified by Weston Brass PharmD.         Prior Anticoagulation Instructions: INR 2.5  Continue same dose of 1 tablet every day except 1/2 tablet on Sunday and Thursday   Current Anticoagulation Instructions: INR 2.1  Continue same dose of 1 tablet every day except 1/2 tablet on Sunday and Thursday

## 2010-08-07 NOTE — Medication Information (Signed)
Summary: rov/ln  Anticoagulant Therapy  Managed by: Bethena Midget, RN, BSN Referring MD: Willa Rough MD PCP: Illene Regulus, MD Supervising MD: Daleen Squibb MD, Maisie Fus Indication 1: Atrial Fibrillation (ICD-427.31) Lab Used: [facility] Site: [facility] INR POC 1.4 INR RANGE 2 - 3  Dietary changes: no    Health status changes: no    Bleeding/hemorrhagic complications: no    Recent/future hospitalizations: no    Any changes in medication regimen? no    Recent/future dental: no  Any missed doses?: yes     Details: Missed a dose about 5 days ago  Is patient compliant with meds? yes       Allergies: No Known Drug Allergies  Anticoagulation Management History:      The patient is taking warfarin and comes in today for a routine follow up visit.  Positive risk factors for bleeding include an age of 75 years or older.  The bleeding index is 'intermediate risk'.  Positive CHADS2 values include Age > 29 years old.  The start date was 03/20/1998.  Anticoagulation responsible provider: Daleen Squibb MD, Maisie Fus.  INR POC: 1.4.  Cuvette Lot#: [id number].  Exp: 04/2011.    Anticoagulation Management Assessment/Plan:      The patient's current anticoagulation dose is Coumadin 5 mg  tabs: Take as directed by coumadin clinic..  The target INR is 2 - 3.  The next INR is due 02/14/2010.  Anticoagulation instructions were given to patient.  Results were reviewed/authorized by Bethena Midget, RN, BSN.  She was notified by Bethena Midget, RN, BSN.         Prior Anticoagulation Instructions: INR 1.3  Hold coumadin today.  Take 1 tab tomorrow.  Then continue same regimen of 0.5 tab on Sunday and Thursday and 1 tab all other days.  Re-check INR on 7/22.  Current Anticoagulation Instructions: INR 1.4 Today take extra 2.5mg s then resume 5mg s everyday except 2.5mg s on Sundays and Thursdays. Recheck in 2 weeks.

## 2010-08-07 NOTE — Letter (Signed)
Summary: Handout Printed  Printed Handout:  - Coumadin Instructions-w/out Meds 

## 2010-08-07 NOTE — Progress Notes (Signed)
    Immunization History:  Influenza Immunization History:    Influenza:  0.5 ml im l deltoid exp 01/05/2011 [id number] (04/19/2010)

## 2010-08-07 NOTE — Medication Information (Signed)
Summary: rov/tm  Anticoagulant Therapy  Managed by: Bethena Midget, RN, BSN Referring MD: Willa Rough MD PCP: Illene Regulus, MD Supervising MD: Riley Kill MD, Maisie Fus Indication 1: Atrial Fibrillation (ICD-427.31) Lab Used: [facility] Site: [facility] INR POC 2.8 INR RANGE 2 - 3  Dietary changes: no    Health status changes: yes       Details: Pt. compliants of increase of SOB,  radial pulse feels irreg.   Bleeding/hemorrhagic complications: no    Recent/future hospitalizations: no    Any changes in medication regimen? no    Recent/future dental: no  Any missed doses?: no       Is patient compliant with meds? yes      Comments: Triage Nurse called they will do EKG.   Allergies: No Known Drug Allergies  Anticoagulation Management History:      The patient is taking warfarin and comes in today for a routine follow up visit.  Positive risk factors for bleeding include an age of 32 years or older.  The bleeding index is 'intermediate risk'.  Positive CHADS2 values include Age > 22 years old.  The start date was 03/20/1998.  Anticoagulation responsible provider: Riley Kill MD, Maisie Fus.  INR POC: 2.8.  Cuvette Lot#: [id number].  Exp: 11/2010.    Anticoagulation Management Assessment/Plan:      The patient's current anticoagulation dose is Coumadin 5 mg  tabs: Take as directed by coumadin clinic..  The target INR is 2 - 3.  The next INR is due 09/27/2009.  Anticoagulation instructions were given to patient.  Results were reviewed/authorized by Bethena Midget, RN, BSN.  She was notified by Bethena Midget, RN, BSN.         Prior Anticoagulation Instructions: INR 3.0 Continue 5mg s everyday except 2.5mg s on Thursdays and Sundays. Recheck in one week.  Current Anticoagulation Instructions: INR 2.8 Continue 5mg s daily except 2.5mg s on Sundays and Thursdays. Recheck in 3 weeks.

## 2010-08-07 NOTE — Medication Information (Signed)
Summary: rov/tm  Anticoagulant Therapy  Managed by: Bethena Midget, RN, BSN Referring MD: Willa Rough MD PCP: Illene Regulus, MD Supervising MD: Johney Frame MD, Fayrene Fearing Indication 1: Atrial Fibrillation (ICD-427.31) Lab Used: [facility] Site: [facility] INR POC 1.2 INR RANGE 2 - 3  Dietary changes: no    Health status changes: no    Bleeding/hemorrhagic complications: no    Recent/future hospitalizations: no    Any changes in medication regimen? no    Recent/future dental: no  Any missed doses?: yes     Details: Took last dose Saturday for epidureal injection tomorrow  Is patient compliant with meds? yes      Comments: Pending epidureal injection tomorrow  Allergies: No Known Drug Allergies  Anticoagulation Management History:      The patient is taking warfarin and comes in today for a routine follow up visit.  Positive risk factors for bleeding include an age of 75 years or older.  The bleeding index is 'intermediate risk'.  Positive CHADS2 values include Age > 33 years old.  The start date was 03/20/1998.  Anticoagulation responsible provider: Harding Thomure MD, Fayrene Fearing.  INR POC: 1.2.  Cuvette Lot#: [id number].  Exp: 05/2011.    Anticoagulation Management Assessment/Plan:      The patient's current anticoagulation dose is Coumadin 5 mg  tabs: Take as directed by coumadin clinic..  The target INR is 2 - 3.  The next INR is due 03/28/2010.  Anticoagulation instructions were given to patient.  Results were reviewed/authorized by Bethena Midget, RN, BSN.  She was notified by Bethena Midget, RN, BSN.         Prior Anticoagulation Instructions: INR 2.6 Continue 5mg s everyday except 2.5mg s on Thursdays. Recheck in 3 weeks.   Current Anticoagulation Instructions: INR 1.2 Post procedure tomorrow take 1 pill then resume 1 pill everyday except 1/2 pill on Thursdays. Recheck in one week.

## 2010-08-07 NOTE — Medication Information (Signed)
Summary: ccr  Anticoagulant Therapy  Managed by: Bethena Midget, RN, BSN Referring MD: Willa Rough MD PCP: Illene Regulus, MD Supervising MD: Myrtis Ser MD, Tinnie Gens Indication 1: Atrial Fibrillation (ICD-427.31) Lab Used: [facility] Site: [facility] INR POC 2.0 INR RANGE 2 - 3  Dietary changes: no    Health status changes: no    Bleeding/hemorrhagic complications: no    Recent/future hospitalizations: no    Any changes in medication regimen? yes       Details: Hydrocodone PRN  Recent/future dental: no  Any missed doses?: no       Is patient compliant with meds? yes       Allergies: No Known Drug Allergies  Anticoagulation Management History:      The patient is taking warfarin and comes in today for a routine follow up visit.  Positive risk factors for bleeding include an age of 65 years or older.  The bleeding index is 'intermediate risk'.  Positive CHADS2 values include Age > 9 years old.  The start date was 03/20/1998.  Anticoagulation responsible provider: Myrtis Ser MD, Tinnie Gens.  INR POC: 2.0.  Cuvette Lot#: [id number].  Exp: 02/2011.    Anticoagulation Management Assessment/Plan:      The patient's current anticoagulation dose is Coumadin 5 mg  tabs: Take as directed by coumadin clinic..  The target INR is 2 - 3.  The next INR is due 01/17/2010.  Anticoagulation instructions were given to patient.  Results were reviewed/authorized by Bethena Midget, RN, BSN.  She was notified by Bethena Midget, RN, BSN.         Prior Anticoagulation Instructions: INR 2.1  Continue same dose of 1 tablet every day except 1/2 tablet on Sunday and Thursday   Current Anticoagulation Instructions: INR 2.0 Today take extra 2.5mg s then  resume 5mg s everyday except 2.5mg s Sundays and Thursdays. Recheck in 4 weeks.

## 2010-08-07 NOTE — Medication Information (Signed)
Summary: rov/cs  Anticoagulant Therapy  Managed by: Bethena Midget, RN, BSN Referring MD: Willa Rough MD PCP: Illene Regulus, MD Supervising MD: Eden Emms MD, Theron Arista Indication 1: Atrial Fibrillation (ICD-427.31) Lab Used: [facility] Site: [facility] INR POC 2.1 INR RANGE 2 - 3  Dietary changes: no    Health status changes: no    Bleeding/hemorrhagic complications: no    Recent/future hospitalizations: no    Any changes in medication regimen? no    Recent/future dental: no  Any missed doses?: no       Is patient compliant with meds? yes       Allergies: No Known Drug Allergies  Anticoagulation Management History:      The patient is taking warfarin and comes in today for a routine follow up visit.  Positive risk factors for bleeding include an age of 75 years or older and history of CVA/TIA.  The bleeding index is 'intermediate risk'.  Positive CHADS2 values include Age > 37 years old and Prior Stroke/CVA/TIA.  The start date was 03/20/1998.  Anticoagulation responsible provider: Eden Emms MD, Theron Arista.  INR POC: 2.1.  Cuvette Lot#: [id number].  Exp: 05/2011.    Anticoagulation Management Assessment/Plan:      The patient's current anticoagulation dose is Coumadin 5 mg  tabs: Take as directed by coumadin clinic..  The target INR is 2 - 3.  The next INR is due 06/13/2010.  Anticoagulation instructions were given to patient.  Results were reviewed/authorized by Bethena Midget, RN, BSN.  She was notified by Bethena Midget, RN, BSN.         Prior Anticoagulation Instructions: INR 2.3  Continue Coumadin as scheduled:  1 tablet every day of the week, except 1/2 tablet on Thursday.    Return to clinic in 4 weeks.   Current Anticoagulation Instructions: INR 2.1 Continue 5mg s daily except 2.5mg s on Thursdays. Recheck in 4 weeks.

## 2010-08-07 NOTE — Medication Information (Signed)
Summary: rov/eh  Anticoagulant Therapy  Managed by: Eda Keys, PharmD Referring MD: Willa Rough MD PCP: Illene Regulus, MD Supervising MD: Gala Romney MD, Reuel Boom Indication 1: Atrial Fibrillation (ICD-427.31) Lab Used: [facility] Site: [facility] INR POC 3.2 INR RANGE 2 - 3  Dietary changes: yes       Details: less green vegetables vs normal  Health status changes: no    Bleeding/hemorrhagic complications: no    Recent/future hospitalizations: no    Any changes in medication regimen? no    Recent/future dental: no  Any missed doses?: no       Is patient compliant with meds? yes      Comments: Resumed coumadin this past thursday after having 3 injections for back pain, including 1 epidural.  Current Medications (verified): 1)  Coumadin 5 Mg  Tabs (Warfarin Sodium) .... Take As Directed By Coumadin Clinic. 2)  Glucosamine Chondroitin Complx   Tabs (Glucosamine-Chondroit-Vit C-Mn) .... Take Once Daily 3)  B Complex   Tabs (B Complex Vitamins) .... Take Once Daily 4)  Selenium 50 Mcg  Tabs (Selenium) .... Take Once Daily 5)  Ambien 5 Mg  Tabs (Zolpidem Tartrate) .... Take 1/4 Tablet At Bedtime 6)  Coq10 .... Take Once Daily 7)  Calcium Carbonate 1250 Mg/56ml Susp (Calcium Carbonate) .... 2 Tablespoons Daily 8)  Carvedilol 6.25 Mg  Tabs (Carvedilol) .... Take One Tablet Two Times A Day 9)  Vitamin C Cr 1000 Mg Cr-Tabs (Ascorbic Acid) .Marland Kitchen.. 1 Tablet By Mouth Three Times A Day 10)  Furosemide 40 Mg Tabs (Furosemide) .Marland Kitchen.. 1 Tablet By Mouth Once Daily 11)  Omeprazole 20 Mg  Cpdr (Omeprazole) .Marland Kitchen.. 1 Twice A Day 30 Minutes Before The First Meal of The Day and Hs 12)  Vitamin D 1000 Unit Tabs (Cholecalciferol) .... 2 Tabs Daily 13)  Cyclobenzaprine Hcl 5 Mg Tabs (Cyclobenzaprine Hcl) .Marland Kitchen.. 1 By Mouth Three Times A Day For Muscle Spasm  Allergies (verified): No Known Drug Allergies  Anticoagulation Management History:      The patient is taking warfarin and comes in today  for a routine follow up visit.  Positive risk factors for bleeding include an age of 14 years or older.  The bleeding index is 'intermediate risk'.  Positive CHADS2 values include Age > 42 years old.  The start date was 03/20/1998.  Anticoagulation responsible provider: Ajia Chadderdon MD, Reuel Boom.  INR POC: 3.2.  Cuvette Lot#: [id number].  Exp: 10/2010.    Anticoagulation Management Assessment/Plan:      The patient's current anticoagulation dose is Coumadin 5 mg  tabs: Take as directed by coumadin clinic..  The target INR is 2 - 3.  The next INR is due 08/23/2009.  Anticoagulation instructions were given to patient.  Results were reviewed/authorized by Eda Keys, PharmD.  She was notified by Eda Keys.         Prior Anticoagulation Instructions: INR 1.3  Resume dose of 5mg  daily after procedure per MD instructions. Recheck in 1 week.  Current Anticoagulation Instructions: INR 3.2  Take 1/2 tablet today.  Then start new dosing schedule of 1 tablet (5 mg) daily, except for 1/2 tablet (2.5 mg) on Thursday.   Return to clinic in 2 weeks.  Appended Document: Coumadin Clinic    Anticoagulant Therapy  Managed by: Eda Keys, PharmD Referring MD: Willa Rough MD PCP: Illene Regulus, MD Supervising MD: Gala Romney MD, Reuel Boom Indication 1: Atrial Fibrillation (ICD-427.31) Lab Used: [facility] Site: [facility] INR RANGE 2 - 3  Allergies: No Known Drug Allergies  Anticoagulation Management History:      Positive risk factors for bleeding include an age of 33 years or older.  The bleeding index is 'intermediate risk'.  Positive CHADS2 values include Age > 22 years old.  The start date was 03/20/1998.  Anticoagulation responsible provider: Lizeth Bencosme MD, Reuel Boom.  Exp: 10/2010.    Anticoagulation Management Assessment/Plan:      The patient's current anticoagulation dose is Coumadin 5 mg  tabs: Take as directed by coumadin clinic..  The target INR is 2 - 3.  The next  INR is due 08/23/2009.  Anticoagulation instructions were given to patient.  Results were reviewed/authorized by Eda Keys, PharmD.         Prior Anticoagulation Instructions: INR 3.2  Take 1/2 tablet today.  Then start new dosing schedule of 1 tablet (5 mg) daily, except for 1/2 tablet (2.5 mg) on Thursday.   Return to clinic in 2 weeks.  Current Anticoagulation Instructions: INR 3.2  Take 1/2 tablet today.  Then resume new dosing schedule of 1 tablet (5 mg) daily, except 1/2 tablet (2.5 mg) on Thursday. Return to clinic in 2 weeks.

## 2010-08-07 NOTE — Progress Notes (Signed)
Summary: FATIGUE  Phone Note Call from Patient   Summary of Call: Pt was seen recently but forgot to mention one complaint. She feels "worn out" all the time and wants to know if there is a medication she could take? Advised pt she may need another f/u office visit but I would check w/MD first.  Initial call taken by: Lamar Sprinkles, CMA,  April 19, 2010 3:11 PM  Follow-up for Phone Call        need to allow 2-3 more weeks post-hospitlization. If no recovery of energy will see in the office.  Follow-up by: Jacques Navy MD,  April 19, 2010 5:35 PM  Additional Follow-up for Phone Call Additional follow up Details #1::        Pt informed  Additional Follow-up by: Lamar Sprinkles, CMA,  April 20, 2010 10:04 AM

## 2010-08-07 NOTE — Medication Information (Signed)
Summary: rov/mw  Anticoagulant Therapy  Managed by: Weston Brass, PharmD Referring MD: Willa Rough MD PCP: Illene Regulus, MD Supervising MD: Daleen Squibb MD, Maisie Fus Indication 1: Atrial Fibrillation (ICD-427.31) Lab Used: [facility] Site: [facility] INR POC 2.3 INR RANGE 2 - 3  Dietary changes: no    Health status changes: no    Bleeding/hemorrhagic complications: no    Recent/future hospitalizations: no    Any changes in medication regimen? no    Recent/future dental: no  Any missed doses?: no       Is patient compliant with meds? yes      Comments: Pt reports that her legs hurt all the time, and this began when she started Coumadin.  She also complains of a rash on her legs that is from her Coumadin.    Allergies: No Known Drug Allergies  Anticoagulation Management History:      The patient is taking warfarin and comes in today for a routine follow up visit.  Positive risk factors for bleeding include an age of 76 years or older and history of CVA/TIA.  The bleeding index is 'intermediate risk'.  Positive CHADS2 values include Age > 32 years old and Prior Stroke/CVA/TIA.  The start date was 03/20/1998.  Anticoagulation responsible provider: Daleen Squibb MD, Maisie Fus.  INR POC: 2.3.  Cuvette Lot#: [id number].  Exp: 05/2011.    Anticoagulation Management Assessment/Plan:      The patient's current anticoagulation dose is Coumadin 5 mg  tabs: Take as directed by coumadin clinic..  The target INR is 2 - 3.  The next INR is due 05/16/2010.  Anticoagulation instructions were given to patient.  Results were reviewed/authorized by Weston Brass, PharmD.  She was notified by Haynes Hoehn, PharmD Candidate.         Prior Anticoagulation Instructions: INR 2.5 Continue taking 1 tablet everyday except a half tablet on thursday. Recheck in 2 weeks.   Current Anticoagulation Instructions: INR 2.3  Continue Coumadin as scheduled:  1 tablet every day of the week, except 1/2 tablet on Thursday.     Return to clinic in 4 weeks.

## 2010-08-07 NOTE — Medication Information (Signed)
Summary: rov.mp  Anticoagulant Therapy  Managed by: Weston Brass, PharmD Referring MD: Willa Rough MD PCP: Illene Regulus, MD Supervising MD: Shirlee Latch MD, Mahesh Sizemore Indication 1: Atrial Fibrillation (ICD-427.31) Lab Used: [facility] Site: [facility] INR POC 2.5 INR RANGE 2 - 3  Dietary changes: no    Health status changes: no    Bleeding/hemorrhagic complications: yes       Details: fell a few weeks ago.  Went to [facility] and was evaluated.  Still will lots of bruising and soreness from fall.  Will set up appt to see Dr. Myrtis Ser in the near future  Recent/future hospitalizations: no    Any changes in medication regimen? yes       Details: taking 1 hydrocodone in the morning to help with pain  Recent/future dental: no  Any missed doses?: no       Is patient compliant with meds? yes       Allergies: No Known Drug Allergies  Anticoagulation Management History:      The patient is taking warfarin and comes in today for a routine follow up visit.  Positive risk factors for bleeding include an age of 75 years or older.  The bleeding index is 'intermediate risk'.  Positive CHADS2 values include Age > 57 years old.  The start date was 03/20/1998.  Anticoagulation responsible provider: Shirlee Latch MD, Arty Lantzy.  INR POC: 2.5.  Cuvette Lot#: [id number].  Exp: 11/2010.    Anticoagulation Management Assessment/Plan:      The patient's current anticoagulation dose is Coumadin 5 mg  tabs: Take as directed by coumadin clinic..  The target INR is 2 - 3.  The next INR is due 11/22/2009.  Anticoagulation instructions were given to patient.  Results were reviewed/authorized by Weston Brass, PharmD.  She was notified by Weston Brass PharmD.         Prior Anticoagulation Instructions: INR 3.1  Take 0.5 tab on each Sunday and Thursday, and 1 tab on all other days.    Current Anticoagulation Instructions: INR 2.5  Continue same dose of 1 tablet every day except 1/2 tablet on Sunday and Thursday

## 2010-08-07 NOTE — Assessment & Plan Note (Signed)
Summary: rov   Visit Type:  Follow-up Primary Provider:  Illene Regulus, MD  CC:  shortness of breath.  History of Present Illness: Patient has had some mild shortness of breath.  She also describes some tightness in her legs at rest.  His current more in the morning and evening and does not sound like tightness from edema.  However she has had some shortness of breath.  She admits that she has not been taking her furosemide regularly. She slipped on her kitchen floor recently and fell and injured herself.  She required 7 stitches above her left eye.  She is stable now.  Current Medications (verified): 1)  Coumadin 5 Mg  Tabs (Warfarin Sodium) .... Take As Directed By Coumadin Clinic. 2)  Glucosamine Chondroitin Complx   Tabs (Glucosamine-Chondroit-Vit C-Mn) .... Take Once Daily 3)  B Complex   Tabs (B Complex Vitamins) .... Take Once Daily 4)  Selenium 50 Mcg  Tabs (Selenium) .... Take Once Daily 5)  Ambien 5 Mg  Tabs (Zolpidem Tartrate) .... Take 1/4 Tablet At Bedtime 6)  Coq10 .... Take Once Daily 7)  Calcium Carbonate 1250 Mg/25ml Susp (Calcium Carbonate) .... 2 Tablespoons Daily 8)  Carvedilol 6.25 Mg  Tabs (Carvedilol) .... Take One Tablet Two Times A Day 9)  Vitamin C Cr 1000 Mg Cr-Tabs (Ascorbic Acid) .Marland Kitchen.. 1 Tablet By Mouth Three Times A Day 10)  Furosemide 40 Mg Tabs (Furosemide) .Marland Kitchen.. 1 Tablet By Mouth Once Daily 11)  Vitamin D 1000 Unit Tabs (Cholecalciferol) .... 2 Tabs Daily  Allergies (verified): No Known Drug Allergies  Past History:  Past Medical History: Last updated: 05/01/2009 Hx of INTERNAL HEMORRHOIDS (ICD-455.0) Hx of DIVERTICULOSIS, COLON (ICD-562.10) COLONIC POLYPS (ICD-211.3) VERTIGO, HX OF (ICD-V12.49) CAROTID STENOSIS BILATERAL (ICD-433.10)...mild... Doppler... August, 2009 SKIN CANCER, HX OF (ICD-V10.83) CORONARY ARTERY DISEASE MILD (ICD-414.00)...question coronary artery spasm in the past. MITRAL REGURGITATION, MILD (ICD-396.3) COUMADIN THERAPY  (ICD-V58.61) ZENKER'S DIVERTICULUM (ICD-530.6) ALLERGIC RHINITIS (ICD-477.9) ATRIAL FIBRILLATION, CHRONIC (ICD-427.31) PLANTAR FASCIITIS, RIGHT (ICD-728.71) EF 55% .. echo.. march, 2007... improved from prior LV dysfunction Tricuspid regurgitation mild to moderate... echo... march 2007 Volume overload... episode October, 2010 shortness of breath from not taking her meds for 3 days stabilized. History SCHAMBERG purpura (progressive pigmentary purpura) skin changes on legs from Coumadin History of leg discomfort but normal leg Dopplers multiple fractures from fall 2005 stabilized orthostatic hypotension...mild vertigo....mild hoarseness.Marland KitchenENT evaluation.. Dr.Shoemaker.. march, 2010... no masses found.. no cord dysfunction.... treated with PPI and other reflux measures and followup at [facility] as needed we're prior surgery for Zenker's diverticulum has been done MIXED HEARING LOSS BILATERAL (ICD-389.22) FIBROMYALGIA (ICD-729.1) CHRONIC FOLLICULAR CONJUNCTIVITIS (ICD-372.12) Eyelid surgery    Review of Systems       Patient denies fever, chills no headache, sweats, rash, change in vision, change in hearing, chest pain, cough, nausea vomiting, urinary symptoms.  All of the systems are reviewed and are negative.  Vital Signs:  Patient profile:   77 year old female Height:      61.5 inches Weight:      119 pounds BMI:     22.20 Pulse rate:   65 / minute BP sitting:   122 / 80  (left arm) Cuff size:   regular  Vitals Entered By: Hardin Negus, RMA (Nov 13, 2009 2:54 PM)  Physical Exam  General:  patient is stable in general. Head:  head is atraumatic.  The site of the sutures above her left iris completely healed. Eyes:  no xanthelasma. Neck:  no jugular venous distention. Chest Wall:  no chest wall tenderness. Lungs:  there are a few rales in her right base. Heart:  cardiac exam reveals S1 and S2.  There is a soft systolic murmur. Abdomen:  abdomen is soft. Msk:  no musculoskeletal  deformities. Extremities:  trace peripheral edema. Skin:  patient has the old changes in the skin of her lower leg and we have called Schamberg purpura from Coumadin Psych:  patient is oriented to person time and place.  She is a little down today.  She continues to try to help take care of her sister who is overwhelmed by taking care of her husband.  I had a long talk with the patient to be sure that she takes care of herself.   Impression & Recommendations:  Problem # 1:  DYSPNEA (ICD-786.05) At this point the patient may be mildly volume overloaded.  She is not taking her Lasix regularly.  We talked about this completely and she will take it daily.  We will check hematology chemistry and thyroid functions today.  I will see her back for a followup to see the response to taking the Lasix regularly.  Problem # 2:  FLUID OVERLOAD (ICD-276.6) I believe there is some mild fluid overload.  See the discussion above under "dyspnea."  Problem # 3:  CAROTID STENOSIS BILATERAL (ICD-433.10)  Her updated medication list for this problem includes:    Coumadin 5 Mg Tabs (Warfarin sodium) .Marland Kitchen... Take as directed by coumadin clinic. The patient has mild carotid stenosis.  She does not need a follow up Doppler at this time.  Problem # 4:  ATRIAL FIBRILLATION, CHRONIC (ICD-427.31) Atrial fibrillation rate is controlled.  She is on Coumadin.  No change in therapy.  Other Orders: TLB-BMP (Basic Metabolic Panel-BMET) (80048-METABOL) TLB-CBC Platelet - w/Differential (85025-CBCD) TLB-TSH (Thyroid Stimulating Hormone) (84443-TSH)  Patient Instructions: 1)  Take Furosemide every day 2)  Labs today 3)  Follow up in 2 weeks

## 2010-08-07 NOTE — Medication Information (Signed)
Summary: rov/tm  Anticoagulant Therapy  Managed by: Weston Brass, PharmD Referring MD: Willa Rough MD PCP: Illene Regulus, MD Supervising MD: Shirlee Latch MD, Dalton Indication 1: Atrial Fibrillation (ICD-427.31) Lab Used: [facility] Site: [facility] INR POC 1.4 INR RANGE 2 - 3  Dietary changes: yes       Details: less greens since in hospital  Health status changes: yes       Details: recent TIA, see comments below  Bleeding/hemorrhagic complications: no    Recent/future hospitalizations: no    Any changes in medication regimen? no    Recent/future dental: no  Any missed doses?: no       Is patient compliant with meds? yes      Comments: Pt was off of Coumadin x 5 days last week for injection, then restarted Coumadin on 9/16. On 9/17, pt had TIA and doesn't know if she took it on Saturday, but went to the hospital and they confirmed TIA. Stayed in hospital 9/17 and 9/18 and came home on 9/19. Pt was given Coumadin + ASA in hospital. When pt was discharged, she was put on Coumadin 5 mg once daily except 2.5 mg on Thursday(but pt decided to take 1 today instead anyway).   Allergies: No Known Drug Allergies  Anticoagulation Management History:      The patient is taking warfarin and comes in today for a routine follow up visit.  Positive risk factors for bleeding include an age of 7 years or older.  The bleeding index is 'intermediate risk'.  Positive CHADS2 values include Age > 24 years old.  The start date was 03/20/1998.  Anticoagulation responsible provider: Shirlee Latch MD, [place].  INR POC: 1.4.  Cuvette Lot#: [id number].  Exp: 05/2011.    Anticoagulation Management Assessment/Plan:      The patient's current anticoagulation dose is Coumadin 5 mg  tabs: Take as directed by coumadin clinic..  The target INR is 2 - 3.  The next INR is due 04/05/2010.  Anticoagulation instructions were given to patient.  Results were reviewed/authorized by Weston Brass, PharmD.  She was notified by Harrel Carina, PharmD candidate.        Coagulation management information includes: Pt has already taken 1 whole tablet today (Thursday).  Prior Anticoagulation Instructions: INR 1.2 Post procedure tomorrow take 1 pill then resume 1 pill everyday except 1/2 pill on Thursdays. Recheck in one week.   Current Anticoagulation Instructions: INR 1.4  Take an extra 1/2 tablet today and  take 1 1/2 tablets tomorrow (Friday). Then resume regular dose of 1 tablet everyday except take  1/2 tablet on Thursday. Re-check INR in 1 week.

## 2010-08-07 NOTE — Letter (Signed)
Summary: [facility]   Imported By: Lennie Odor 11/21/2009 17:08:24  _____________________________________________________________________  External Attachment:    Type:   Image     Comment:   External Document

## 2010-08-07 NOTE — Medication Information (Signed)
Summary: rov/tm  Anticoagulant Therapy  Managed by: Shelby Dubin, PharmD, BCPS, CPP Referring MD: Willa Rough MD PCP: Illene Regulus, MD Supervising MD: Johney Frame MD, Fayrene Fearing Indication 1: Atrial Fibrillation (ICD-427.31) Lab Used: [facility] Site: [facility] INR POC 3.1 INR RANGE 2 - 3  Dietary changes: no    Health status changes: no    Bleeding/hemorrhagic complications: no    Recent/future hospitalizations: no    Any changes in medication regimen? no    Recent/future dental: no  Any missed doses?: yes     Details: has been taking 2.5 mg Thursdays only  Is patient compliant with meds? yes       Allergies (verified): No Known Drug Allergies  Anticoagulation Management History:      The patient is taking warfarin and comes in today for a routine follow up visit.  Positive risk factors for bleeding include an age of 75 years or older.  The bleeding index is 'intermediate risk'.  Positive CHADS2 values include Age > 75 years old.  The start date was 03/20/1998.  Anticoagulation responsible provider: Toniyah Dilmore MD, Fayrene Fearing.  INR POC: 3.1.  Exp: 11/2010.    Anticoagulation Management Assessment/Plan:      The patient's current anticoagulation dose is Coumadin 5 mg  tabs: Take as directed by coumadin clinic..  The target INR is 2 - 3.  The next INR is due 10/25/2009.  Anticoagulation instructions were given to patient.  Results were reviewed/authorized by Shelby Dubin, PharmD, BCPS, CPP.  She was notified by Shelby Dubin PharmD, BCPS, CPP.         Prior Anticoagulation Instructions: INR 2.8 Continue 5mg s daily except 2.5mg s on Sundays and Thursdays. Recheck in 3 weeks.   Current Anticoagulation Instructions: INR 3.1  Take 0.5 tab on each Sunday and Thursday, and 1 tab on all other days.

## 2010-08-07 NOTE — Medication Information (Signed)
Summary: rov/eac  Anticoagulant Therapy  Managed by: Bethena Midget, RN, BSN Referring MD: Willa Rough MD PCP: Illene Regulus, MD Supervising MD: Johney Frame MD, Fayrene Fearing Indication 1: Atrial Fibrillation (ICD-427.31) Lab Used: [facility] Site: [facility] INR POC 3.0 INR RANGE 2 - 3  Dietary changes: no    Health status changes: no    Bleeding/hemorrhagic complications: no    Recent/future hospitalizations: no    Any changes in medication regimen? no    Recent/future dental: no  Any missed doses?: yes     Details: skipped Mondays dose because she was instructed to by cvrr personal  Is patient compliant with meds? yes       Allergies: No Known Drug Allergies  Anticoagulation Management History:      The patient is taking warfarin and comes in today for a routine follow up visit.  Positive risk factors for bleeding include an age of 75 years or older.  The bleeding index is 'intermediate risk'.  Positive CHADS2 values include Age > 47 years old.  The start date was 03/20/1998.  Anticoagulation responsible provider: Kaleyah Labreck MD, Fayrene Fearing.  INR POC: 3.0.  Cuvette Lot#: [id number].  Exp: 10/2010.    Anticoagulation Management Assessment/Plan:      The patient's current anticoagulation dose is Coumadin 5 mg  tabs: Take as directed by coumadin clinic..  The target INR is 2 - 3.  The next INR is due 08/30/2009.  Anticoagulation instructions were given to patient.  Results were reviewed/authorized by Bethena Midget, RN, BSN.  She was notified by Bethena Midget, RN, BSN.         Prior Anticoagulation Instructions: INR 3.2  Take 1/2 tablet today.  Then resume new dosing schedule of 1 tablet (5 mg) daily, except 1/2 tablet (2.5 mg) on Thursday. Return to clinic in 2 weeks.  Current Anticoagulation Instructions: INR 3.0 Continue 5mg s everyday except 2.5mg s on Thursdays and Sundays. Recheck in one week.

## 2010-08-07 NOTE — Medication Information (Signed)
Summary: rov/tm  Anticoagulant Therapy  Managed by: Bethena Midget, RN, BSN Referring MD: Willa Rough MD PCP: Illene Regulus, MD Supervising MD: Jens Som MD, Arlys John Indication 1: Atrial Fibrillation (ICD-427.31) Lab Used: [facility] Site: [facility] INR POC 1.8 INR RANGE 2 - 3  Dietary changes: no    Health status changes: no    Bleeding/hemorrhagic complications: no    Recent/future hospitalizations: no    Any changes in medication regimen? no    Recent/future dental: no  Any missed doses?: no       Is patient compliant with meds? yes       Allergies: No Known Drug Allergies  Anticoagulation Management History:      The patient is taking warfarin and comes in today for a routine follow up visit.  Positive risk factors for bleeding include an age of 75 years or older.  The bleeding index is 'intermediate risk'.  Positive CHADS2 values include Age > 75 years old.  The start date was 03/20/1998.  Anticoagulation responsible provider: Jens Som MD, Arlys John.  INR POC: 1.8.  Cuvette Lot#: [id number].  Exp: 04/2011.    Anticoagulation Management Assessment/Plan:      The patient's current anticoagulation dose is Coumadin 5 mg  tabs: Take as directed by coumadin clinic..  The target INR is 2 - 3.  The next INR is due 02/28/2010.  Anticoagulation instructions were given to patient.  Results were reviewed/authorized by Bethena Midget, RN, BSN.  She was notified by Liana Gerold, PharmD Candidate.         Prior Anticoagulation Instructions: INR 1.4 Today take extra 2.5mg s then resume 5mg s everyday except 2.5mg s on Sundays and Thursdays. Recheck in 2 weeks.   Current Anticoagulation Instructions: Take an extra 1/2 tablet today, then begin taking 1 tablet daily except for 1/2 tablet on Thursdays. Recheck in 2 weeks.

## 2010-08-07 NOTE — Medication Information (Signed)
Summary: rov/ewj  Anticoagulant Therapy  Managed by: Weston Brass, pharm D  Referring MD: Willa Rough MD PCP: Illene Regulus, MD Supervising MD: Antoine Poche MD, Fayrene Fearing Indication 1: Atrial Fibrillation (ICD-427.31) Lab Used: [facility] Site: [facility] INR POC 2.3 INR RANGE 2 - 3  Dietary changes: no    Health status changes: yes       Details: broke her back about   Bleeding/hemorrhagic complications: no    Recent/future hospitalizations: yes       Details: has an epidural  and procedure scheduled today at [facility]   Recent/future dental: no  Any missed doses?: yes     Details: was off coumadin for 3 days now   Is patient compliant with meds? yes       Allergies: No Known Drug Allergies  Anticoagulation Management History:      The patient is taking warfarin and comes in today for a routine follow up visit.  Positive risk factors for bleeding include an age of 75 years or older.  The bleeding index is 'intermediate risk'.  Positive CHADS2 values include Age > 66 years old.  The start date was 03/20/1998.  Anticoagulation responsible provider: Antoine Poche MD, Fayrene Fearing.  INR POC: 2.3.  Cuvette Lot#: [id number].  Exp: 08/2010.    Anticoagulation Management Assessment/Plan:      The patient's current anticoagulation dose is Coumadin 5 mg  tabs: Take as directed by coumadin clinic..  The target INR is 2 - 3.  The next INR is due 07/17/2009.  Anticoagulation instructions were given to patient.  Results were reviewed/authorized by Weston Brass, pharm D .  She was notified by Ysidro Evert, Pharm D Candidate.         Prior Anticoagulation Instructions: INR 1.4  Hold warfarin today, then resume 1 tab daily after procedure.   Recheck in 1 week.  Call with questions.    Current Anticoagulation Instructions: INR 2.3 Stay off coumadin and recheck INR on Monday for the procedure

## 2010-08-07 NOTE — Medication Information (Signed)
Summary: rov/tm  Anticoagulant Therapy  Managed by: Weston Brass, PharmD Referring MD: Willa Rough MD PCP: Illene Regulus, MD Supervising MD: Johney Frame MD, Fayrene Fearing Indication 1: Atrial Fibrillation (ICD-427.31) Lab Used: [facility] Site: [facility] INR POC 1.3 INR RANGE 2 - 3  Dietary changes: no    Health status changes: no    Bleeding/hemorrhagic complications: no    Recent/future hospitalizations: no    Any changes in medication regimen? yes       Details: spinal injection tomorrow  Recent/future dental: no  Any missed doses?: yes     Details: has not taken any coumadin since saturday 7/10 due to procedure  Is patient compliant with meds? yes       Allergies: No Known Drug Allergies  Anticoagulation Management History:      The patient is taking warfarin and comes in today for a routine follow up visit.  Positive risk factors for bleeding include an age of 75 years or older.  The bleeding index is 'intermediate risk'.  Positive CHADS2 values include Age > 75 years old.  The start date was 03/20/1998.  Anticoagulation responsible provider: Rosiland Sen MD, Fayrene Fearing.  INR POC: 1.3.  Cuvette Lot#: [id number].  Exp: 03/2011.    Anticoagulation Management Assessment/Plan:      The patient's current anticoagulation dose is Coumadin 5 mg  tabs: Take as directed by coumadin clinic..  The target INR is 2 - 3.  The next INR is due 01/26/2010.  Anticoagulation instructions were given to patient.  Results were reviewed/authorized by Weston Brass, PharmD.  She was notified by Dillard Cannon.         Prior Anticoagulation Instructions: INR 2.0 Today take extra 2.5mg s then  resume 5mg s everyday except 2.5mg s Sundays and Thursdays. Recheck in 4 weeks.   Current Anticoagulation Instructions: INR 1.3  Hold coumadin today.  Take 1 tab tomorrow.  Then continue same regimen of 0.5 tab on Sunday and Thursday and 1 tab all other days.  Re-check INR on 7/22.

## 2010-08-07 NOTE — Progress Notes (Signed)
Summary: re poss shingle shot  Phone Note Call from Patient   Caller: Patient Reason for Call: Talk to Nurse Summary of Call: pt calling to see if we think she should get a shingle shot? [phone] Initial call taken by: Glynda Jaeger,  May 14, 2010 4:35 PM  Follow-up for Phone Call        advised was go to do if hasn't had, she will f/u w/pcp Meredith Staggers, RN  May 14, 2010 5:48 PM

## 2010-08-07 NOTE — Assessment & Plan Note (Signed)
Summary: f25m   Visit Type:  Follow-up Primary Provider:  Illene Regulus, MD  CC:  atrial fibrillation.  History of Present Illness: The patient is seen for followup in posthospitalization. She had a TIA.  She had sudden weakness in her right hand and drooping of the right mouth.  This improved by the time she reached the hospital.  She is very carefully evaluated by Dr. Debby Bud.  It was noted she had carotid Dopplers that looked good recently.  2-D echo showed good LV function and no obvious cardiac source of embolus.  Head CT did not show any acute abnormality.  She was recoumadinized and she's been stable since then.    Current Medications (verified): 1)  Coumadin 5 Mg  Tabs (Warfarin Sodium) .... Take As Directed By Coumadin Clinic. 2)  Glucosamine Chondroitin Complx   Tabs (Glucosamine-Chondroit-Vit C-Mn) .... Take Once Daily 3)  B Complex   Tabs (B Complex Vitamins) .... Take Once Daily 4)  Selenium 50 Mcg  Tabs (Selenium) .... Take Once Daily 5)  Ambien 5 Mg  Tabs (Zolpidem Tartrate) .... Take 1/4 Tablet At Bedtime 6)  Coq10 .... Take Once Daily 7)  Calcium Carbonate 1250 Mg/38ml Susp (Calcium Carbonate) .... 2 Tablespoons Daily 8)  Carvedilol 6.25 Mg  Tabs (Carvedilol) .... Take One Tablet Two Times A Day 9)  Vitamin C Cr 1000 Mg Cr-Tabs (Ascorbic Acid) .Marland Kitchen.. 1 Tablet By Mouth Three Times A Day 10)  Furosemide 40 Mg Tabs (Furosemide) .Marland Kitchen.. 1 Tablet By Mouth Once Daily 11)  Vitamin D 1000 Unit Tabs (Cholecalciferol) .... 2 Tabs Daily  Allergies (verified): No Known Drug Allergies  Past History:  Past Medical History: Hx of INTERNAL HEMORRHOIDS (ICD-455.0) Hx of DIVERTICULOSIS, COLON (ICD-562.10) COLONIC POLYPS (ICD-211.3) VERTIGO, HX OF (ICD-V12.49) CAROTID STENOSIS BILATERAL (ICD-433.10)...mild... Doppler... August, 2009  /  0-39% bilateral... Doppler.. August, 2011 SKIN CANCER, HX OF (ICD-V10.83) CORONARY ARTERY DISEASE MILD (ICD-414.00)...question coronary artery spasm in  the past. MITRAL REGURGITATION, MILD (ICD-396.3) COUMADIN THERAPY (ICD-V58.61) ZENKER'S DIVERTICULUM (ICD-530.6) ALLERGIC RHINITIS (ICD-477.9) ATRIAL FIBRILLATION, CHRONIC (ICD-427.31) PLANTAR FASCIITIS, RIGHT (ICD-728.71) EF 55% .. echo.. march, 2007... improved from prior LV dysfunction Tricuspid regurgitation mild to moderate... echo... march 2007 Volume overload... episode October, 2010 shortness of breath from not taking her meds for 3 days stabilized. History SCHAMBERG purpura (progressive pigmentary purpura) skin changes on legs from Coumadin History of leg discomfort but normal leg Dopplers multiple fractures from fall 2005 stabilized orthostatic hypotension...mild vertigo....mild hoarseness.Marland KitchenENT evaluation.. Dr.Shoemaker.. march, 2010... no masses found.. no cord dysfunction.... treated with PPI and other reflux measures and followup at [facility] as needed we're prior surgery for Zenker's diverticulum has been done MIXED HEARING LOSS BILATERAL (ICD-389.22) FIBROMYALGIA (ICD-729.1) CHRONIC FOLLICULAR CONJUNCTIVITIS (ICD-372.12) Eyelid surgery TIA   September, 2011 ... while patient was off Coumadin briefly...head CT no acute abnormality... 2-D echo normal LV function... symptoms resolved completely... patient back on Coumadin EF  60%... echo.. September, 2011    Review of Systems       Patient denies fever, chills, headache, sweats, rash, change in vision, change in hearing, chest pain, cough, nausea vomiting, urinary symptoms.  All of the systems are reviewed and are negative  Vital Signs:  Patient profile:   77 year old female Height:      61.5 inches Weight:      116 pounds Pulse rate:   77 / minute BP sitting:   132 / 76  (left arm) Cuff size:   regular  Vitals Entered By: Hardin Negus,  RMA (May 25, 2010 11:27 AM)  Physical Exam  General:  patient looks quite stable today. Head:  head is atraumatic. Eyes:  no xanthelasma. Neck:  no jugular venous  distention. Chest Wall:  no chest wall tenderness. Lungs:  lungs are clear.  Respiratory effort is not labored. Heart:  cardiac exam reveals an S1 and S2.  There are no clicks or significant murmurs. Abdomen:  abdomen is soft. Msk:  no musculoskeletal deformities. Extremities:  no peripheral edema. Skin:  no skin rashes. Psych:  patient is oriented to person time and place.  Affect is normal.   Impression & Recommendations:  Problem # 1:  TIA (ICD-435.9) The patient has recovered from her TIA.  I have carefully reviewed all of the hospital records.  She has been very nicely managed.  In the future we will have to use bridging therapy if her Coumadin has to be held.  Problem # 2:  DYSPNEA (ICD-786.05)  Her updated medication list for this problem includes:    Carvedilol 6.25 Mg Tabs (Carvedilol) .Marland Kitchen... Take one tablet two times a day    Furosemide 40 Mg Tabs (Furosemide) .Marland Kitchen... 1 tablet by mouth once daily The patient has to take a deep breath from time to time I am not sure that she has true shortness of breath.  There is no sign of heart failure.  No further workup.  Problem # 3:  FLUID OVERLOAD (ICD-276.6) Fluid overload is stable.  No change in therapy.  Problem # 4:  CARDIOMYOPATHY, ISCHEMICNON (ICD-414.8)  Her updated medication list for this problem includes:    Coumadin 5 Mg Tabs (Warfarin sodium) .Marland Kitchen... Take as directed by coumadin clinic.    Carvedilol 6.25 Mg Tabs (Carvedilol) .Marland Kitchen... Take one tablet two times a day    Furosemide 40 Mg Tabs (Furosemide) .Marland Kitchen... 1 tablet by mouth once daily The patient actually has excellent LV function.  No further workup.  Problem # 5:  CAROTID STENOSIS BILATERAL (ICD-433.10)  Her updated medication list for this problem includes:    Coumadin 5 Mg Tabs (Warfarin sodium) .Marland Kitchen... Take as directed by coumadin clinic. We know the patient has only mild carotid disease.  No further workup.  Problem # 6:  CORONARY ARTERY DISEASE MILD  (ICD-414.00)  Her updated medication list for this problem includes:    Coumadin 5 Mg Tabs (Warfarin sodium) .Marland Kitchen... Take as directed by coumadin clinic.    Carvedilol 6.25 Mg Tabs (Carvedilol) .Marland Kitchen... Take one tablet two times a day  Orders: EKG w/ Interpretation (93000) She has mild coronary artery disease.  EKG is done today reviewed by me.  There is no significant change.  She has atrial fibrillation with a controlled rate.  No further workup.  Problem # 7:  FIBROMYALGIA (ICD-729.1) The patient had questions for me today concerning her fibromyalgia.  I was unable to give her any further insight.  Patient Instructions: 1)  Follow up in 3 months.

## 2010-08-07 NOTE — Medication Information (Signed)
Summary: rov/jaj  Anticoagulant Therapy  Managed by: Lyna Poser, PharmD Referring MD: Willa Rough MD PCP: Illene Regulus, MD Supervising MD: Shirlee Latch MD, [place] Indication 1: Atrial Fibrillation (ICD-427.31) Lab Used: [facility] Site: [facility] INR POC 2.5 INR RANGE 2 - 3           Allergies: No Known Drug Allergies  Anticoagulation Management History:      Positive risk factors for bleeding include an age of 75 years or older.  The bleeding index is 'intermediate risk'.  Positive CHADS2 values include Age > 75 years old.  The start date was 03/20/1998.  Anticoagulation responsible provider: Shirlee Latch MD, [place].  INR POC: 2.5.  Exp: 05/2011.    Anticoagulation Management Assessment/Plan:      The patient's current anticoagulation dose is Coumadin 5 mg  tabs: Take as directed by coumadin clinic..  The target INR is 2 - 3.  The next INR is due 04/19/2010.  Anticoagulation instructions were given to patient.  Results were reviewed/authorized by Lyna Poser, PharmD.         Prior Anticoagulation Instructions: INR 1.4  Take an extra 1/2 tablet today and  take 1 1/2 tablets tomorrow (Friday). Then resume regular dose of 1 tablet everyday except take  1/2 tablet on Thursday. Re-check INR in 1 week.   Current Anticoagulation Instructions: INR 2.5 Continue taking 1 tablet everyday except a half tablet on thursday. Recheck in 2 weeks.

## 2010-08-07 NOTE — Medication Information (Signed)
Summary: [facility] Physician Orders  [facility] Physician Orders   Imported By: Roderic Ovens 09/28/2009 15:46:29  _____________________________________________________________________  External Attachment:    Type:   Image     Comment:   External Document

## 2010-08-07 NOTE — Progress Notes (Signed)
Summary: pt will require additional injection/time off warfarin  Phone Note Call from Patient   Caller: Patient Call For: CVRR--Dr. Jimmey Ralph Summary of Call: Pt called to say that Dr. Otelia Sergeant has determined pt will require additional epidural injection on Monday 07/31/2009.  (Sciatic nerve pain bilaterally).   Will need appt on 1/24.   Initial call taken by: Bethena Midget, RN, BSN,  July 25, 2009 11:36 AM  Follow-up for Phone Call        appt will be scheduled for patient when she calls back with info on time for injection on Monday.  .  flag sent to Dr. Cathie Olden.Marland Kitchenmp Follow-up by: Shelby Dubin, PharmD, BCPS, CPP 07/25/2009 at 1100 am.  Additional Follow-up for Phone Call Additional follow up Details #1::        0K   Additional Follow-up by: Talitha Givens, MD, [facility],  July 28, 2009 2:29 PM

## 2010-08-07 NOTE — Assessment & Plan Note (Signed)
Summary: EKG  Nurse Visit   Vital Signs:  Patient profile:   77 year old female Pulse rate:   76 / minute Pulse rhythm:   irregular BP supine:   130 / 84  (left arm) Cuff size:   regular  Impression & Recommendations: Called to coumadin clinic to check pt and do EKG. Pt complained of feeling SOB when getting out of car on way in to clinic. She took deep breaths and felt better by the time she got to elevator.  Irregular heart rate in coumadin clinic but pt with known afib.  Pt checked and at this time is feeling better with no chest pain or SOB. Pt reports she has been under a lot of stress lately.  EKG done which shows afib 77.  EKG reviewed by Dr. Riley Kill.  Discussed with Herbert Seta and she will review with Dr. Myrtis Ser when he is back in office and follow up with pt on any recommendations made by Dr. Myrtis Ser.  Pt with no complaints upon leaving today.   Allergies: No Known Drug Allergies  Appended Document: EKG spoke w/pt, she is feeling ok and has had no more episodes

## 2010-08-07 NOTE — Progress Notes (Signed)
Summary: NEEDS OV  Phone Note Call from Patient Call back at Home Phone [phone]   Summary of Call: Pt's sister says pt is very "emotional" and would like rx from MD. Someone suggested xanax. Does she need office visit?  Initial call taken by: Lamar Sprinkles, CMA,  March 15, 2010 3:20 PM  Follow-up for Phone Call        OV tomorrow or Monday Follow-up by: Jacques Navy MD,  March 15, 2010 5:38 PM  Additional Follow-up for Phone Call Additional follow up Details #1::        No answer, no vm.....................Marland KitchenLamar Sprinkles, CMA  March 15, 2010 5:55 PM   Pt stated that she is unable to come in for OV today as she is going visit her brother-in-law in hospital. She also says she think he may be pass over the weekend so OV on Monday would be unlikely. Pt is again requesting Rx for mild anti-anxiety medicine. [facility] [address] Additional Follow-up by: Margaret Pyle, CMA,  March 16, 2010 9:08 AM    Additional Follow-up for Phone Call Additional follow up Details #2::    OK- alprazolam 0.25mg  1 by mouth q6 as needed, #30, no refills Follow-up by: Jacques Navy MD,  March 16, 2010 1:01 PM  New/Updated Medications: ALPRAZOLAM 0.25 MG TABS (ALPRAZOLAM) 1 by mouth every 6 hours as needed Prescriptions: ALPRAZOLAM 0.25 MG TABS (ALPRAZOLAM) 1 by mouth every 6 hours as needed  #30 x 0   Entered by:   Ami Bullins CMA   Authorized by:   Jacques Navy MD   Signed by:   Bill Salinas CMA on 03/16/2010   Method used:   Telephoned to ...       [facility]  [address] [phone]* (retail)       [address]       [place], [place]  [address]       Ph: [phone]       Fax: [phone]   RxID:   [id number]

## 2010-08-07 NOTE — Assessment & Plan Note (Signed)
Summary: POST HOSP/ THINGS TO DISCUSS/NWS  #   Vital Signs:  Patient profile:   77 year old female Height:      61.5 inches Weight:      113 pounds BMI:     21.08 O2 Sat:      99 % on Room air Temp:     97.0 degrees F oral Pulse rate:   75 / minute BP sitting:   112 / 76  (left arm) Cuff size:   regular  Vitals Entered By: Bill Salinas CMA (April 05, 2010 4:48 PM)  O2 Flow:  Room air CC: Pt here for hosp fu/ ab Comments Pt states she is no longer taking Hydrocodone or Alprazolam/ ab   Primary Care Provider:  Illene Regulus, MD  CC:  Pt here for hosp fu/ ab.  History of Present Illness: Patient presents for post-hospital visit after hospitalizatin Sept 17-19 for TIA. She had come off coumadin for 5 days prior to [facility] for back pain. She had transient paresthesia with a rapid recovery. Her in hospital studies reviewed: nl CT, nl 2 D echo, nl labs. She was started back on coumadin. She had been discharged in good condition.   Since disharge she has done OK. No further neurologic symptoms. she has been to the coag clinic and INR 2.5   She feels nauseated a lot. she feels fatigued much of the time. she has been loosing weight. Her by mouth intake is poor. Chart reviewed:                      Date     5/27     5/9       06/19/09      10/29     9/30                      wt       116    119         121               123      120    Current Medications (verified): 1)  Coumadin 5 Mg  Tabs (Warfarin Sodium) .... Take As Directed By Coumadin Clinic. 2)  Glucosamine Chondroitin Complx   Tabs (Glucosamine-Chondroit-Vit C-Mn) .... Take Once Daily 3)  B Complex   Tabs (B Complex Vitamins) .... Take Once Daily 4)  Selenium 50 Mcg  Tabs (Selenium) .... Take Once Daily 5)  Ambien 5 Mg  Tabs (Zolpidem Tartrate) .... Take 1/4 Tablet At Bedtime 6)  Coq10 .... Take Once Daily 7)  Calcium Carbonate 1250 Mg/1ml Susp (Calcium Carbonate) .... 2 Tablespoons Daily 8)  Carvedilol 6.25 Mg  Tabs  (Carvedilol) .... Take One Tablet Two Times A Day 9)  Vitamin C Cr 1000 Mg Cr-Tabs (Ascorbic Acid) .Marland Kitchen.. 1 Tablet By Mouth Three Times A Day 10)  Furosemide 40 Mg Tabs (Furosemide) .Marland Kitchen.. 1 Tablet By Mouth Once Daily 11)  Vitamin D 1000 Unit Tabs (Cholecalciferol) .... 2 Tabs Daily 12)  Hydrocodone-Acetaminophen 2.5-500 Mg Tabs (Hydrocodone-Acetaminophen) .... As Needed 13)  Alprazolam 0.25 Mg Tabs (Alprazolam) .Marland Kitchen.. 1 By Mouth Every 6 Hours As Needed  Allergies (verified): No Known Drug Allergies  Past History:  Past Medical History: Last updated: 05/01/2009 Hx of INTERNAL HEMORRHOIDS (ICD-455.0) Hx of DIVERTICULOSIS, COLON (ICD-562.10) COLONIC POLYPS (ICD-211.3) VERTIGO, HX OF (ICD-V12.49) CAROTID STENOSIS BILATERAL (ICD-433.10)...mild... Doppler... August, 2009 SKIN CANCER, HX OF (ICD-V10.83) CORONARY ARTERY  DISEASE MILD (ICD-414.00)...question coronary artery spasm in the past. MITRAL REGURGITATION, MILD (ICD-396.3) COUMADIN THERAPY (ICD-V58.61) ZENKER'S DIVERTICULUM (ICD-530.6) ALLERGIC RHINITIS (ICD-477.9) ATRIAL FIBRILLATION, CHRONIC (ICD-427.31) PLANTAR FASCIITIS, RIGHT (ICD-728.71) EF 55% .. echo.. march, 2007... improved from prior LV dysfunction Tricuspid regurgitation mild to moderate... echo... march 2007 Volume overload... episode October, 2010 shortness of breath from not taking her meds for 3 days stabilized. History SCHAMBERG purpura (progressive pigmentary purpura) skin changes on legs from Coumadin History of leg discomfort but normal leg Dopplers multiple fractures from fall 2005 stabilized orthostatic hypotension...mild vertigo....mild hoarseness.Marland KitchenENT evaluation.. Dr.Shoemaker.. march, 2010... no masses found.. no cord dysfunction.... treated with PPI and other reflux measures and followup at [facility] as needed we're prior surgery for Zenker's diverticulum has been done MIXED HEARING LOSS BILATERAL (ICD-389.22) FIBROMYALGIA (ICD-729.1) CHRONIC FOLLICULAR  CONJUNCTIVITIS (ICD-372.12) Eyelid surgery    Past Surgical History: Last updated: 09/17/2007 * EYELID SURGERY * SHOULDER SURGERY * ZENKER DIVERTICULAR REPAIR X2 FOOT SURGERY, HX OF (ICD-V15.89) INTRAOCULAR LENS IMPLANT, LEFT EYE, HX OF (ICD-V43.1) CATARACT EXTRACTION, LEFT EYE, HX OF (ICD-V45.61) HEMORRHOIDECTOMY, HX OF (ICD-V45.89) INGUINAL HERNIORRHAPHY, RIGHT, HX OF (ICD-V45.89) TONSILLECTOMY, HX OF (ICD-V45.79) PSH reviewed for relevance, FH reviewed for relevance  Review of Systems       The patient complains of anorexia, weight loss, and abdominal pain.  The patient denies fever, vision loss, hoarseness, chest pain, dyspnea on exertion, prolonged cough, hemoptysis, severe indigestion/heartburn, muscle weakness, difficulty walking, depression, abnormal bleeding, and enlarged lymph nodes.    Physical Exam  General:  elderly wite woman in no distress and quite a talker Head:  normocephalic and atraumatic.   Eyes:  pupils equal and pupils round.   Lungs:  normal respiratory effort.   Heart:  IRIR rate controlled Abdomen:  soft.  Mild tenderness in the epigastrum. No guarding or rebound Msk:  no joint tenderness, no joint swelling, and no redness over joints.   Pulses:  2+ radial Neurologic:  alert & oriented X3, cranial nerves II-XII intact, and gait normal.   Skin:  poor turgor. Normal color Cervical Nodes:  no anterior cervical adenopathy and no posterior cervical adenopathy.   Psych:  Oriented X3, memory intact for recent and remote, normally interactive, and good eye contact.     Impression & Recommendations:  Problem # 1:  TIA (ICD-435.9) Patient with very minor neurologic event while off coumadin. Nl CT and other studies. She has no sequellae. She is back on coumadin.   Her updated medication list for this problem includes:    Coumadin 5 Mg Tabs (Warfarin sodium) .Marland Kitchen... Take as directed by coumadin clinic.  Problem # 2:  VERTIGO, HX OF (ICD-V12.49)  patient c/o  balance problems and would like help with this.  Plan - refer to outpatient rehab for balance training.  Orders: Rehabilitation Referral (Rehab)  Problem # 3:  LOSS OF WEIGHT (ICD-783.21) patient with 10lbs of weight loss over 12 months. She does admit to a queasy stomach and decreased calorie intake.  Plan - H2 blocker for dyspepsia - otc ranitidine 75mg  two times a day or famotodine 20mg  daily           provided names of nutritions: hunter, collins  Complete Medication List: 1)  Coumadin 5 Mg Tabs (Warfarin sodium) .... Take as directed by coumadin clinic. 2)  Glucosamine Chondroitin Complx Tabs (Glucosamine-chondroit-vit c-mn) .... Take once daily 3)  B Complex Tabs (B complex vitamins) .... Take once daily 4)  Selenium 50 Mcg Tabs (Selenium) .... Take once daily 5)  Ambien 5 Mg Tabs (Zolpidem tartrate) .... Take 1/4 tablet at bedtime 6)  Coq10  .... Take once daily 7)  Calcium Carbonate 1250 Mg/40ml Susp (Calcium carbonate) .... 2 tablespoons daily 8)  Carvedilol 6.25 Mg Tabs (Carvedilol) .... Take one tablet two times a day 9)  Vitamin C Cr 1000 Mg Cr-tabs (Ascorbic acid) .Marland Kitchen.. 1 tablet by mouth three times a day 10)  Furosemide 40 Mg Tabs (Furosemide) .Marland Kitchen.. 1 tablet by mouth once daily 11)  Vitamin D 1000 Unit Tabs (Cholecalciferol) .... 2 tabs daily 12)  Hydrocodone-acetaminophen 2.5-500 Mg Tabs (Hydrocodone-acetaminophen) .... As needed 13)  Alprazolam 0.25 Mg Tabs (Alprazolam) .Marland Kitchen.. 1 by mouth every 6 hours as needed

## 2010-08-07 NOTE — Medication Information (Signed)
Summary: rov/tm  Anticoagulant Therapy  Managed by: Bethena Midget, RN, BSN Referring MD: Willa Rough MD PCP: Illene Regulus, MD Supervising MD: Johney Frame MD, Fayrene Fearing Indication 1: Atrial Fibrillation (ICD-427.31) Lab Used: [facility] Site: [facility] INR POC 2.6 INR RANGE 2 - 3  Dietary changes: no    Health status changes: no    Bleeding/hemorrhagic complications: no    Recent/future hospitalizations: no    Any changes in medication regimen? no    Recent/future dental: no  Any missed doses?: no       Is patient compliant with meds? yes       Allergies: No Known Drug Allergies  Anticoagulation Management History:      The patient is taking warfarin and comes in today for a routine follow up visit.  Positive risk factors for bleeding include an age of 75 years or older.  The bleeding index is 'intermediate risk'.  Positive CHADS2 values include Age > 75 years old.  The start date was 03/20/1998.  Anticoagulation responsible provider: Camelle Henkels MD, Fayrene Fearing.  INR POC: 2.6.  Cuvette Lot#: [id number].  Exp: 04/2011.    Anticoagulation Management Assessment/Plan:      The patient's current anticoagulation dose is Coumadin 5 mg  tabs: Take as directed by coumadin clinic..  The target INR is 2 - 3.  The next INR is due 03/21/2010.  Anticoagulation instructions were given to patient.  Results were reviewed/authorized by Bethena Midget, RN, BSN.  She was notified by Bethena Midget, RN, BSN.         Prior Anticoagulation Instructions: Take an extra 1/2 tablet today, then begin taking 1 tablet daily except for 1/2 tablet on Thursdays. Recheck in 2 weeks.   Current Anticoagulation Instructions: INR 2.6 Continue 5mg s everyday except 2.5mg s on Thursdays. Recheck in 3 weeks.

## 2010-08-07 NOTE — Progress Notes (Signed)
Summary: calling re fax/**called pt. no answer/nm  Phone Note Call from Patient   Caller: Patient Reason for Call: Talk to Nurse Summary of Call: pt calling to see if we rec fax from dr nitka's office re going off coumadin for 4 days, they haven't heard from [place] and they said they faxed [place] on monday -pt wants [place] to let them know if it's ok fax [phone] and she wants a call from [place] [phone] Initial call taken by: Glynda Jaeger,  March 09, 2010 9:34 AM  Follow-up for Phone Call        Attempted to call patient x2 with no answer. Ollen Gross, RN, BSN  March 09, 2010 9:47 AM    Additional Follow-up for Phone Call Additional follow up Details #1::        [place]  Talitha Givens, MD, [facility]  March 09, 2010 2:16 PM      Appended Document: calling re fax/**called pt. no answer/nm Request form to discontinue anticoagulants for 5 days signed by MD faxed  to [facility]. to Allissa l. Moore.

## 2010-08-07 NOTE — Progress Notes (Signed)
  Phone Note Call from Patient   Reason for Call: Acute Illness Summary of Call: Mrs. Lastra called stating that she has been under a lot stress, burying her brother in law this week.  She also had to have a procedure requiring her to stop her coumain for 3 days.She takes coumadin for atrial fibrillation. She is currently back on the coumadin.  She states she felt her hand become numb for a short period of time, and now feels that the right side of her face is drooping, with diffiucutly controlling her tongue (I heard no slurring or stumbling of words.)  She wanted to know what to do.  I have advised her to seek evaluation in ER. Her closest one is in [facility]  I have advised that she call 911 instead of driving with her recent symptoms to be evaluated for possible CVA. she initially refused to go to ER but relented.  She says that the ER is too stressful.  I reminded her that if she is having a CVA she should not wait to be seen as an outpatient.  She said that she would take a Xanax before going and agreed to call 911. Initial call taken by: Joni Reining NP     Appended Document:  Agree

## 2010-08-07 NOTE — Medication Information (Signed)
Summary: [name]  Anticoagulant Therapy  Managed by: Cloyde Reams, RN Referring MD: Willa Rough MD PCP: Illene Regulus, MD Supervising MD: Excell Seltzer MD, Casimiro Needle Indication 1: Atrial Fibrillation (ICD-427.31) Lab Used: [facility] Site: [facility] INR POC 1.3 INR RANGE 2 - 3  Dietary changes: no    Health status changes: no    Bleeding/hemorrhagic complications: no    Recent/future hospitalizations: yes       Details: Pt. having epidural tomorrow for back pain.  Any changes in medication regimen? no    Recent/future dental: no  Any missed doses?: no       Is patient compliant with meds? yes      Comments: Pt. has been off coumadin since Saturday, 1/22. Pending epidural tomorrow at [facility]  Allergies: No Known Drug Allergies  Anticoagulation Management History:      The patient is taking warfarin and comes in today for a routine follow up visit.  Positive risk factors for bleeding include an age of 75 years or older.  The bleeding index is 'intermediate risk'.  Positive CHADS2 values include Age > 32 years old.  The start date was 03/20/1998.  Anticoagulation responsible provider: Excell Seltzer MD, Casimiro Needle.  INR POC: 1.3.  Cuvette Lot#: [id number].  Exp: 10/2010.    Anticoagulation Management Assessment/Plan:      The patient's current anticoagulation dose is Coumadin 5 mg  tabs: Take as directed by coumadin clinic..  The target INR is 2 - 3.  The next INR is due 08/08/2009.  Anticoagulation instructions were given to patient.  Results were reviewed/authorized by Cloyde Reams, RN.  She was notified by Lew Dawes, PharmD Candidate.         Prior Anticoagulation Instructions: INR 1.0 Resume dose of 5mg  daily per MD instructions. Recheck in one week.   Current Anticoagulation Instructions: INR 1.3  Resume dose of 5mg  daily after procedure per MD instructions. Recheck in 1 week.

## 2010-08-07 NOTE — Progress Notes (Signed)
Summary: pt fell/coumadin instructs  Phone Note Call from Patient   Caller: Patient Call For: Coumadin Clinic Summary of Call: Pt called states she fell in her home yesterday.  Slipped in house, wet shoes, "not her fault" hit head.  Had CT scan done at [facility].  had to get stiches, all bruised up.  Pt reports lots of bleeding.  Pt wants to know if she should hold today's dosage of coumadin.  Pt had bloodwork done on 10/10/09 at med ctr.  Will look up blood work and call pt back.   Initial call taken by: Cloyde Reams RN,  October 11, 2009 12:40 PM  Follow-up for Phone Call        INR on 10/10/09 was 2.79.  Instructed pt to continue on same dosage of coumadin and monitor bruising and bleeding.  Seek medical attention if worsens.   Follow-up by: Cloyde Reams RN,  October 11, 2009 4:01 PM

## 2010-08-07 NOTE — Miscellaneous (Signed)
Summary: Orders Update  Clinical Lists Changes  Orders: Added new Test order of Carotid Duplex (Carotid Duplex) - Signed 

## 2010-08-07 NOTE — Progress Notes (Signed)
Summary: returning call  Phone Note Call from Patient Call back at Home Phone [phone]   Caller: Patient Reason for Call: Talk to Nurse Summary of Call: returning call Initial call taken by: Migdalia Dk,  Nov 17, 2009 2:38 PM  Follow-up for Phone Call        Phone Call Completed Follow-up by: Scherrie Bateman, LPN,  Nov 17, 2009 2:50 PM

## 2010-08-07 NOTE — Assessment & Plan Note (Signed)
Summary: 2wk f/u ok per heather/sl   Visit Type:  2 weeks follow up Primary Provider:  Illene Regulus, MD  CC:  fluid overload.  History of Present Illness: The patient returns for follow up of fluid overload.  I saw her last Nov 13, 2009.  At that time I thought there was mild fluid overload causing shortness of breath.  I encouraged her to take her Lasix regularly.  She has done this.  She is diuresed a few pounds and she feels better.  Chemistry was checked before the diuresis and she was stable.  We will recheck again today and keep her on the same medications.  Current Medications (verified): 1)  Coumadin 5 Mg  Tabs (Warfarin Sodium) .... Take As Directed By Coumadin Clinic. 2)  Glucosamine Chondroitin Complx   Tabs (Glucosamine-Chondroit-Vit C-Mn) .... Take Once Daily 3)  B Complex   Tabs (B Complex Vitamins) .... Take Once Daily 4)  Selenium 50 Mcg  Tabs (Selenium) .... Take Once Daily 5)  Ambien 5 Mg  Tabs (Zolpidem Tartrate) .... Take 1/4 Tablet At Bedtime 6)  Coq10 .... Take Once Daily 7)  Calcium Carbonate 1250 Mg/70ml Susp (Calcium Carbonate) .... 2 Tablespoons Daily 8)  Carvedilol 6.25 Mg  Tabs (Carvedilol) .... Take One Tablet Two Times A Day 9)  Vitamin C Cr 1000 Mg Cr-Tabs (Ascorbic Acid) .Marland Kitchen.. 1 Tablet By Mouth Three Times A Day 10)  Furosemide 40 Mg Tabs (Furosemide) .Marland Kitchen.. 1 Tablet By Mouth Once Daily 11)  Vitamin D 1000 Unit Tabs (Cholecalciferol) .... 2 Tabs Daily 12)  Hydrocodone-Acetaminophen 2.5-500 Mg Tabs (Hydrocodone-Acetaminophen) .... As Needed  Allergies (verified): No Known Drug Allergies  Past History:  Past Medical History: Last updated: 05/01/2009 Hx of INTERNAL HEMORRHOIDS (ICD-455.0) Hx of DIVERTICULOSIS, COLON (ICD-562.10) COLONIC POLYPS (ICD-211.3) VERTIGO, HX OF (ICD-V12.49) CAROTID STENOSIS BILATERAL (ICD-433.10)...mild... Doppler... August, 2009 SKIN CANCER, HX OF (ICD-V10.83) CORONARY ARTERY DISEASE MILD (ICD-414.00)...question coronary  artery spasm in the past. MITRAL REGURGITATION, MILD (ICD-396.3) COUMADIN THERAPY (ICD-V58.61) ZENKER'S DIVERTICULUM (ICD-530.6) ALLERGIC RHINITIS (ICD-477.9) ATRIAL FIBRILLATION, CHRONIC (ICD-427.31) PLANTAR FASCIITIS, RIGHT (ICD-728.71) EF 55% .. echo.. march, 2007... improved from prior LV dysfunction Tricuspid regurgitation mild to moderate... echo... march 2007 Volume overload... episode October, 2010 shortness of breath from not taking her meds for 3 days stabilized. History SCHAMBERG purpura (progressive pigmentary purpura) skin changes on legs from Coumadin History of leg discomfort but normal leg Dopplers multiple fractures from fall 2005 stabilized orthostatic hypotension...mild vertigo....mild hoarseness.Marland KitchenENT evaluation.. Dr.Shoemaker.. march, 2010... no masses found.. no cord dysfunction.... treated with PPI and other reflux measures and followup at [facility] as needed we're prior surgery for Zenker's diverticulum has been done MIXED HEARING LOSS BILATERAL (ICD-389.22) FIBROMYALGIA (ICD-729.1) CHRONIC FOLLICULAR CONJUNCTIVITIS (ICD-372.12) Eyelid surgery    Review of Systems       Patient denies fever, chills, headache, sweats, rash, change in vision, change in hearing, chest pain, cough, nausea vomiting, urinary symptoms.  All of the systems are reviewed and are negative.  Vital Signs:  Patient profile:   77 year old female Height:      61.5 inches Weight:      116.50 pounds BMI:     21.73 Pulse rate:   80 / minute Pulse rhythm:   regular Resp:     18 per minute BP sitting:   108 / 64  (left arm) Cuff size:   regular  Vitals Entered By: Vikki Ports (Dec 01, 2009 12:01 PM)  Physical Exam  General:  patient is stable today.  Eyes:  no xanthelasma. Neck:  no jugular distention. Lungs:  lungs are clear.  Respiratory effort is nonlabored. Heart:  cardiac exam reveals S1-S2.  There are no clicks or significant murmurs.  The rhythm is irregularly irregular. Abdomen:   abdomen is soft. Extremities:  no peripheral edema. Psych:  patient is oriented to person time and place.  Affect is normal.   Impression & Recommendations:  Problem # 1:  DYSPNEA (ICD-786.05)  Her updated medication list for this problem includes:    Carvedilol 6.25 Mg Tabs (Carvedilol) .Marland Kitchen... Take one tablet two times a day    Furosemide 40 Mg Tabs (Furosemide) .Marland Kitchen... 1 tablet by mouth once daily Dyspnea is better with mild diuresis.  Continue same therapy.  Chemistry lab to be checked today. When checked previously hemoglobin was stable and thyroid function was stable.  Problem # 2:  CORONARY ARTERY DISEASE MILD (ICD-414.00)  Her updated medication list for this problem includes:    Coumadin 5 Mg Tabs (Warfarin sodium) .Marland Kitchen... Take as directed by coumadin clinic.    Carvedilol 6.25 Mg Tabs (Carvedilol) .Marland Kitchen... Take one tablet two times a day Coronary disease is stable.  No change in therapy.  Problem # 3:  ATRIAL FIBRILLATION, CHRONIC (ICD-427.31)  Her updated medication list for this problem includes:    Coumadin 5 Mg Tabs (Warfarin sodium) .Marland Kitchen... Take as directed by coumadin clinic.    Carvedilol 6.25 Mg Tabs (Carvedilol) .Marland Kitchen... Take one tablet two times a day Atrial fibrillation is stable.  No change in therapy.  Other Orders: TLB-BMP (Basic Metabolic Panel-BMET) (80048-METABOL)  Patient Instructions: 1)  Labs today 2)  Follow up in 6 months

## 2010-08-07 NOTE — Progress Notes (Signed)
Summary: holding coumadin  Phone Note Call from Patient   Summary of Call: pt states she needs to have a shot in her back (? cortizone), they want her to hold her coumadin for 5 days prior, will send mess to Dr Myrtis Ser for reveiw  Initial call taken by: Meredith Staggers, RN,  January 09, 2010 2:24 PM  Follow-up for Phone Call        OK  Talitha Givens, MD, [facility]  January 09, 2010 5:54 PM  Left message ok to hold coumadin, to call back if has questions Meredith Staggers, RN  January 10, 2010 9:04 AM

## 2010-08-07 NOTE — Medication Information (Signed)
Summary: rov/sp  Anticoagulant Therapy  Managed by: Bethena Midget, RN, BSN Referring MD: Willa Rough MD PCP: Illene Regulus, MD Supervising MD: Tenny Craw MD, Gunnar Fusi Indication 1: Atrial Fibrillation (ICD-427.31) Lab Used: [facility] Site: [facility] INR POC 1.0 INR RANGE 2 - 3  Dietary changes: no    Health status changes: no    Bleeding/hemorrhagic complications: no    Recent/future hospitalizations: no    Any changes in medication regimen? no    Recent/future dental: no  Any missed doses?: yes     Details: Has been off coumadin since 5th for epidural injection today  Is patient compliant with meds? yes       Allergies: No Known Drug Allergies  Anticoagulation Management History:      The patient is taking warfarin and comes in today for a routine follow up visit.  Positive risk factors for bleeding include an age of 75 years or older.  The bleeding index is 'intermediate risk'.  Positive CHADS2 values include Age > 44 years old.  The start date was 03/20/1998.  Anticoagulation responsible provider: Tenny Craw MD, Gunnar Fusi.  INR POC: 1.0.  Cuvette Lot#: [id number].  Exp: 08/2010.    Anticoagulation Management Assessment/Plan:      The patient's current anticoagulation dose is Coumadin 5 mg  tabs: Take as directed by coumadin clinic..  The target INR is 2 - 3.  The next INR is due 07/26/2009.  Anticoagulation instructions were given to patient.  Results were reviewed/authorized by Bethena Midget, RN, BSN.  She was notified by Bethena Midget, RN, BSN.         Prior Anticoagulation Instructions: INR 2.3 Stay off coumadin and recheck INR on Monday for the procedure  Current Anticoagulation Instructions: INR 1.0 Resume dose of 5mg  daily per MD instructions. Recheck in one week.

## 2010-08-07 NOTE — Miscellaneous (Signed)
  Clinical Lists Changes  Problems: Added new problem of TIA (ICD-435.9) Observations: Added new observation of PAST MED HX: Hx of INTERNAL HEMORRHOIDS (ICD-455.0) Hx of DIVERTICULOSIS, COLON (ICD-562.10) COLONIC POLYPS (ICD-211.3) VERTIGO, HX OF (ICD-V12.49) CAROTID STENOSIS BILATERAL (ICD-433.10)...mild... Doppler... August, 2009 SKIN CANCER, HX OF (ICD-V10.83) CORONARY ARTERY DISEASE MILD (ICD-414.00)...question coronary artery spasm in the past. MITRAL REGURGITATION, MILD (ICD-396.3) COUMADIN THERAPY (ICD-V58.61) ZENKER'S DIVERTICULUM (ICD-530.6) ALLERGIC RHINITIS (ICD-477.9) ATRIAL FIBRILLATION, CHRONIC (ICD-427.31) PLANTAR FASCIITIS, RIGHT (ICD-728.71) EF 55% .. echo.. march, 2007... improved from prior LV dysfunction Tricuspid regurgitation mild to moderate... echo... march 2007 Volume overload... episode October, 2010 shortness of breath from not taking her meds for 3 days stabilized. History SCHAMBERG purpura (progressive pigmentary purpura) skin changes on legs from Coumadin History of leg discomfort but normal leg Dopplers multiple fractures from fall 2005 stabilized orthostatic hypotension...mild vertigo....mild hoarseness.Marland KitchenENT evaluation.. Dr.Shoemaker.. march, 2010... no masses found.. no cord dysfunction.... treated with PPI and other reflux measures and followup at [facility] as needed we're prior surgery for Zenker's diverticulum has been done MIXED HEARING LOSS BILATERAL (ICD-389.22) FIBROMYALGIA (ICD-729.1) CHRONIC FOLLICULAR CONJUNCTIVITIS (ICD-372.12) Eyelid surgery TIA   September, 2011 ... while patient was off Coumadin briefly   (05/24/2010 13:10) Added new observation of PRIMARY MD: Illene Regulus, MD (05/24/2010 13:10)       Past History:  Past Medical History: Hx of INTERNAL HEMORRHOIDS (ICD-455.0) Hx of DIVERTICULOSIS, COLON (ICD-562.10) COLONIC POLYPS (ICD-211.3) VERTIGO, HX OF (ICD-V12.49) CAROTID STENOSIS BILATERAL (ICD-433.10)...mild...  Doppler... August, 2009 SKIN CANCER, HX OF (ICD-V10.83) CORONARY ARTERY DISEASE MILD (ICD-414.00)...question coronary artery spasm in the past. MITRAL REGURGITATION, MILD (ICD-396.3) COUMADIN THERAPY (ICD-V58.61) ZENKER'S DIVERTICULUM (ICD-530.6) ALLERGIC RHINITIS (ICD-477.9) ATRIAL FIBRILLATION, CHRONIC (ICD-427.31) PLANTAR FASCIITIS, RIGHT (ICD-728.71) EF 55% .. echo.. march, 2007... improved from prior LV dysfunction Tricuspid regurgitation mild to moderate... echo... march 2007 Volume overload... episode October, 2010 shortness of breath from not taking her meds for 3 days stabilized. History SCHAMBERG purpura (progressive pigmentary purpura) skin changes on legs from Coumadin History of leg discomfort but normal leg Dopplers multiple fractures from fall 2005 stabilized orthostatic hypotension...mild vertigo....mild hoarseness.Marland KitchenENT evaluation.. Dr.Shoemaker.. march, 2010... no masses found.. no cord dysfunction.... treated with PPI and other reflux measures and followup at [facility] as needed we're prior surgery for Zenker's diverticulum has been done MIXED HEARING LOSS BILATERAL (ICD-389.22) FIBROMYALGIA (ICD-729.1) CHRONIC FOLLICULAR CONJUNCTIVITIS (ICD-372.12) Eyelid surgery TIA   September, 2011 ... while patient was off Coumadin briefly

## 2010-08-09 NOTE — Assessment & Plan Note (Signed)
Summary: right sided abd pain/SD   Vital Signs:  Patient profile:   77 year old female Height:      61.5 inches Weight:      118 pounds BMI:     22.01 O2 Sat:      96 % on Room air Temp:     97.6 degrees F oral Pulse rate:   82 / minute BP sitting:   100 / 70  (left arm) Cuff size:   regular  Vitals Entered By: Bill Salinas CMA (June 18, 2010 2:03 PM)  O2 Flow:  Room air CC: pt here with c/o pain and soarness in right hip. Pt just noticed this pain this morning/ ab   Primary Care Provider:  Illene Regulus, MD  CC:  pt here with c/o pain and soarness in right hip. Pt just noticed this pain this morning/ ab.  History of Present Illness: Patient presents with complaint of pain in the right side interpreted as abdominal pain when appointment made. She has no change in bowel habit, no N/V, no fevers or chills. Her discomforts is very localized with radiation. She rates this as a mild discomfort.   Current Medications (verified): 1)  Coumadin 5 Mg  Tabs (Warfarin Sodium) .... Take As Directed By Coumadin Clinic. 2)  Glucosamine Chondroitin Complx   Tabs (Glucosamine-Chondroit-Vit C-Mn) .... Take Once Daily 3)  B Complex   Tabs (B Complex Vitamins) .... Take Once Daily 4)  Selenium 50 Mcg  Tabs (Selenium) .... Take Once Daily 5)  Ambien 5 Mg  Tabs (Zolpidem Tartrate) .... Take 1/4 Tablet At Bedtime 6)  Coq10 .... Take Once Daily 7)  Calcium Carbonate 1250 Mg/25ml Susp (Calcium Carbonate) .... 2 Tablespoons Daily 8)  Carvedilol 6.25 Mg  Tabs (Carvedilol) .... Take One Tablet Two Times A Day 9)  Vitamin C Cr 1000 Mg Cr-Tabs (Ascorbic Acid) .Marland Kitchen.. 1 Tablet By Mouth Three Times A Day 10)  Furosemide 40 Mg Tabs (Furosemide) .Marland Kitchen.. 1 Tablet By Mouth Once Daily 11)  Vitamin D 1000 Unit Tabs (Cholecalciferol) .... 2 Tabs Daily 12)  Hydrocodone-Acetaminophen 5-500 Mg Tabs (Hydrocodone-Acetaminophen) .... 1/2 Tablet As Needed  Allergies (verified): No Known Drug Allergies  Past  History:  Past Medical History: Last updated: 05/25/2010 Hx of INTERNAL HEMORRHOIDS (ICD-455.0) Hx of DIVERTICULOSIS, COLON (ICD-562.10) COLONIC POLYPS (ICD-211.3) VERTIGO, HX OF (ICD-V12.49) CAROTID STENOSIS BILATERAL (ICD-433.10)...mild... Doppler... August, 2009  /  0-39% bilateral... Doppler.. August, 2011 SKIN CANCER, HX OF (ICD-V10.83) CORONARY ARTERY DISEASE MILD (ICD-414.00)...question coronary artery spasm in the past. MITRAL REGURGITATION, MILD (ICD-396.3) COUMADIN THERAPY (ICD-V58.61) ZENKER'S DIVERTICULUM (ICD-530.6) ALLERGIC RHINITIS (ICD-477.9) ATRIAL FIBRILLATION, CHRONIC (ICD-427.31) PLANTAR FASCIITIS, RIGHT (ICD-728.71) EF 55% .. echo.. march, 2007... improved from prior LV dysfunction Tricuspid regurgitation mild to moderate... echo... march 2007 Volume overload... episode October, 2010 shortness of breath from not taking her meds for 3 days stabilized. History SCHAMBERG purpura (progressive pigmentary purpura) skin changes on legs from Coumadin History of leg discomfort but normal leg Dopplers multiple fractures from fall 2005 stabilized orthostatic hypotension...mild vertigo....mild hoarseness.Marland KitchenENT evaluation.. Dr.Shoemaker.. march, 2010... no masses found.. no cord dysfunction.... treated with PPI and other reflux measures and followup at [facility] as needed we're prior surgery for Zenker's diverticulum has been done MIXED HEARING LOSS BILATERAL (ICD-389.22) FIBROMYALGIA (ICD-729.1) CHRONIC FOLLICULAR CONJUNCTIVITIS (ICD-372.12) Eyelid surgery TIA   September, 2011 ... while patient was off Coumadin briefly...head CT no acute abnormality... 2-D echo normal LV function... symptoms resolved completely... patient back on Coumadin EF  60%... echo.. September, 2011  Past Surgical History: Last updated: 09/17/2007 * EYELID SURGERY * SHOULDER SURGERY * ZENKER DIVERTICULAR REPAIR X2 FOOT SURGERY, HX OF (ICD-V15.89) INTRAOCULAR LENS IMPLANT, LEFT EYE, HX OF  (ICD-V43.1) CATARACT EXTRACTION, LEFT EYE, HX OF (ICD-V45.61) HEMORRHOIDECTOMY, HX OF (ICD-V45.89) INGUINAL HERNIORRHAPHY, RIGHT, HX OF (ICD-V45.89) TONSILLECTOMY, HX OF (ICD-V45.79) FH reviewed for relevance, SH/Risk Factors reviewed for relevance  Review of Systems  The patient denies anorexia, fever, weight loss, weight gain, decreased hearing, chest pain, abdominal pain, melena, hematochezia, severe indigestion/heartburn, muscle weakness, difficulty walking, depression, abnormal bleeding, and enlarged lymph nodes.    Physical Exam  General:  WWD woman looking younger than her stated age. Head:  Normocephalic and atraumatic without obvious abnormalities. No apparent alopecia or balding. Eyes:  vision grossly intact, pupils equal, and pupils round. C&S clear without icterus   Neck:  supple.   Lungs:  Normal respiratory effort, chest expands symmetrically. Lungs are clear to auscultation, no crackles or wheezes. Heart:  normal rate and regular rhythm.   Abdomen:  soft, non-tender, normal bowel sounds, no masses, no guarding, no rigidity, and no hepatomegaly.  Tenderness illicited on exam of the superficialy musculature of the right flank. Msk:  normal ROM, no joint tenderness, and no joint swelling.   Pulses:  2+ radial Extremities:  No clubbing, cyanosis, edema, or deformity noted with normal full range of motion of all joints.   Neurologic:  alert & oriented X3, cranial nerves II-XII intact, and gait normal.   Skin:  turgor normal, color normal, and no suspicious lesions.   Psych:  Oriented X3, memory intact for recent and remote, normally interactive, good eye contact, and not anxious appearing.     Impression & Recommendations:  Problem # 1:  FLANK PAIN, RIGHT (ICD-789.09) Benign exam of the abdomen. Localized tenderness at the flank.  Plan - reassurance           apply heat +/- lineament to sore area.  Her updated medication list for this problem includes:     Hydrocodone-acetaminophen 5-500 Mg Tabs (Hydrocodone-acetaminophen) .Marland Kitchen... 1/2 tablet as needed  Complete Medication List: 1)  Coumadin 5 Mg Tabs (Warfarin sodium) .... Take as directed by coumadin clinic. 2)  Glucosamine Chondroitin Complx Tabs (Glucosamine-chondroit-vit c-mn) .... Take once daily 3)  B Complex Tabs (B complex vitamins) .... Take once daily 4)  Selenium 50 Mcg Tabs (Selenium) .... Take once daily 5)  Ambien 5 Mg Tabs (Zolpidem tartrate) .... Take 1/4 tablet at bedtime 6)  Coq10  .... Take once daily 7)  Calcium Carbonate 1250 Mg/34ml Susp (Calcium carbonate) .... 2 tablespoons daily 8)  Carvedilol 6.25 Mg Tabs (Carvedilol) .... Take one tablet two times a day 9)  Vitamin C Cr 1000 Mg Cr-tabs (Ascorbic acid) .Marland Kitchen.. 1 tablet by mouth three times a day 10)  Furosemide 40 Mg Tabs (Furosemide) .Marland Kitchen.. 1 tablet by mouth once daily 11)  Vitamin D 1000 Unit Tabs (Cholecalciferol) .... 2 tabs daily 12)  Hydrocodone-acetaminophen 5-500 Mg Tabs (Hydrocodone-acetaminophen) .... 1/2 tablet as needed   Orders Added: 1)  Est. Patient Level III [46962]

## 2010-08-09 NOTE — Medication Information (Signed)
Summary: rov/sp  Anticoagulant Therapy  Managed by: Leota Sauers, PharmD, BCPS, CPP Referring MD: Willa Rough MD PCP: Illene Regulus, MD Supervising MD: Graciela Husbands MD, Viviann Spare Indication 1: Atrial Fibrillation (ICD-427.31) Lab Used: [facility] Site: [facility] INR POC 2.5 INR RANGE 2 - 3  Dietary changes: yes       Details: more greens  Health status changes: no    Bleeding/hemorrhagic complications: no    Recent/future hospitalizations: no    Any changes in medication regimen? no    Recent/future dental: no  Any missed doses?: no       Is patient compliant with meds? yes       Current Medications (verified): 1)  Coumadin 5 Mg  Tabs (Warfarin Sodium) .... Take As Directed By Coumadin Clinic. 2)  Glucosamine Chondroitin Complx   Tabs (Glucosamine-Chondroit-Vit C-Mn) .... Take Once Daily 3)  B Complex   Tabs (B Complex Vitamins) .... Take Once Daily 4)  Selenium 50 Mcg  Tabs (Selenium) .... Take Once Daily 5)  Ambien 5 Mg  Tabs (Zolpidem Tartrate) .... Take 1/4 Tablet At Bedtime 6)  Coq10 .... Take Once Daily 7)  Calcium Carbonate 1250 Mg/48ml Susp (Calcium Carbonate) .... 2 Tablespoons Daily 8)  Carvedilol 6.25 Mg  Tabs (Carvedilol) .... Take One Tablet Two Times A Day 9)  Vitamin C Cr 1000 Mg Cr-Tabs (Ascorbic Acid) .Marland Kitchen.. 1 Tablet By Mouth Three Times A Day 10)  Furosemide 40 Mg Tabs (Furosemide) .Marland Kitchen.. 1 Tablet By Mouth Once Daily 11)  Vitamin D 1000 Unit Tabs (Cholecalciferol) .... 2 Tabs Daily 12)  Hydrocodone-Acetaminophen 5-500 Mg Tabs (Hydrocodone-Acetaminophen) .... 1/2 Tablet As Needed  Allergies (verified): No Known Drug Allergies  Anticoagulation Management History:      The patient is taking warfarin and comes in today for a routine follow up visit.  Positive risk factors for bleeding include an age of 75 years or older and history of CVA/TIA.  The bleeding index is 'intermediate risk'.  Positive CHADS2 values include Age > 60 years old and Prior Stroke/CVA/TIA.   The start date was 03/20/1998.  Anticoagulation responsible provider: Graciela Husbands MD, Viviann Spare.  INR POC: 2.5.  Cuvette Lot#: [id number].  Exp: 05/2011.    Anticoagulation Management Assessment/Plan:      The patient's current anticoagulation dose is Coumadin 5 mg  tabs: Take as directed by coumadin clinic..  The target INR is 2 - 3.  The next INR is due 08/22/2010.  Anticoagulation instructions were given to patient.  Results were reviewed/authorized by Leota Sauers, PharmD, BCPS, CPP.         Prior Anticoagulation Instructions: INR 2.7  Continue current dose. Take 1 tablet (5mg ) everday except 1/2 tablet (2.5mg ) on Thursdays.  Recheck INR in 4 weeks.   Current Anticoagulation Instructions: INR 2.5  Coumadin 5mg  tabs Take 1 tab each day EXCEPT 1/2 tab on THUR

## 2010-08-09 NOTE — Medication Information (Signed)
Summary: rov/kh  Anticoagulant Therapy  Managed by: Tammy Sours PharmD Referring MD: Willa Rough MD PCP: Illene Regulus, MD Supervising MD: Shirlee Latch MD, Myles Mallicoat Indication 1: Atrial Fibrillation (ICD-427.31) Lab Used: [facility] Site: [facility] INR POC 2.7 INR RANGE 2 - 3  Dietary changes: no    Health status changes: no    Bleeding/hemorrhagic complications: no    Recent/future hospitalizations: no    Any changes in medication regimen? no    Recent/future dental: no  Any missed doses?: no       Is patient compliant with meds? yes       Allergies: No Known Drug Allergies  Anticoagulation Management History:      The patient is taking warfarin and comes in today for a routine follow up visit.  Positive risk factors for bleeding include an age of 22 years or older and history of CVA/TIA.  The bleeding index is 'intermediate risk'.  Positive CHADS2 values include Age > 44 years old and Prior Stroke/CVA/TIA.  The start date was 03/20/1998.  Anticoagulation responsible provider: Shirlee Latch MD, Tamara Kenyon.  INR POC: 2.7.  Cuvette Lot#: [id number].  Exp: 05/2011.    Anticoagulation Management Assessment/Plan:      The patient's current anticoagulation dose is Coumadin 5 mg  tabs: Take as directed by coumadin clinic..  The target INR is 2 - 3.  The next INR is due 07/18/2010.  Anticoagulation instructions were given to patient.  Results were reviewed/authorized by Tammy Sours PharmD.         Prior Anticoagulation Instructions: INR 2.1 Continue 5mg s daily except 2.5mg s on Thursdays. Recheck in 4 weeks.   Current Anticoagulation Instructions: INR 2.7  Continue current dose. Take 1 tablet (5mg ) everday except 1/2 tablet (2.5mg ) on Thursdays.  Recheck INR in 4 weeks.

## 2010-08-09 NOTE — Progress Notes (Signed)
Summary: OV TODAY   Phone Note Call from Patient Call back at [facility] Phone [phone]   Summary of Call: Pt c/o right sided abd pain - tender to touch or upon sitting. No fever or other complaints. Pt unsure if she needs office visit. Advised office visit today. Pt agreed and will call office if anything changes Initial call taken by: Lamar Sprinkles, CMA,  June 18, 2010 9:46 AM

## 2010-08-15 ENCOUNTER — Encounter: Payer: Self-pay | Admitting: Cardiology

## 2010-08-15 ENCOUNTER — Encounter: Payer: Self-pay | Admitting: Internal Medicine

## 2010-08-15 ENCOUNTER — Encounter (INDEPENDENT_AMBULATORY_CARE_PROVIDER_SITE_OTHER): Payer: Medicare Other

## 2010-08-15 DIAGNOSIS — Z7901 Long term (current) use of anticoagulants: Secondary | ICD-10-CM

## 2010-08-15 DIAGNOSIS — I4891 Unspecified atrial fibrillation: Secondary | ICD-10-CM

## 2010-08-15 NOTE — Assessment & Plan Note (Signed)
Summary: back pain radiating down her legs   Vital Signs:  Patient profile:   77 year old female Height:      61.5 inches Weight:      119 pounds BMI:     22.20 O2 Sat:      96 % on Room air Temp:     98.2 degrees F oral Pulse rate:   64 / minute BP sitting:   148 / 90  (left arm) Cuff size:   regular  Vitals Entered By: Bill Salinas CMA (August 07, 2010 3:26 PM)  O2 Flow:  Room air CC: pt here for evaluation of back pain/ ab   Primary Care Provider:  Illene Regulus, MD  CC:  pt here for evaluation of back pain/ ab.  History of Present Illness: Mrs. [name] presents for severe back pain that radiates down the left left. She has a history of DDD and chronic  back pain and has had epidural injections in the past. About 2 weeks ago the pain got a lot worse and radiated all the way down to her foot on the left. She was seen at [facility] orthopedics and had a series of x-rays that reveal lumbar compression fracture; progressive DJD left hip. She was seen by Dr. Otelia Sergeant - she is not a candidate for surgery. She was prescribed hydorcodone/APAP and gabapentin. She found the gabapentin intolerable. Called and discussed case with Dr. Otelia Sergeant.....she has recurrent pain that did respond in the past to [facility]. The issue is managing her anticoagulation given that the last time coumadine was stopped for a procedure she had a stroke.  Current Medications (verified): 1)  Coumadin 5 Mg  Tabs (Warfarin Sodium) .... Take As Directed By Coumadin Clinic. 2)  Glucosamine Chondroitin Complx   Tabs (Glucosamine-Chondroit-Vit C-Mn) .... Take Once Daily 3)  B Complex   Tabs (B Complex Vitamins) .... Take Once Daily 4)  Selenium 50 Mcg  Tabs (Selenium) .... Take Once Daily 5)  Ambien 5 Mg  Tabs (Zolpidem Tartrate) .... Take 1/4 Tablet At Bedtime 6)  Coq10 .... Take Once Daily 7)  Calcium Carbonate 1250 Mg/33ml Susp (Calcium Carbonate) .... 2 Tablespoons Daily 8)  Carvedilol 6.25 Mg  Tabs (Carvedilol) .... Take One  Tablet Two Times A Day 9)  Vitamin C Cr 1000 Mg Cr-Tabs (Ascorbic Acid) .Marland Kitchen.. 1 Tablet By Mouth Three Times A Day 10)  Furosemide 40 Mg Tabs (Furosemide) .Marland Kitchen.. 1 Tablet By Mouth Once Daily 11)  Vitamin D 1000 Unit Tabs (Cholecalciferol) .... 2 Tabs Daily 12)  Hydrocodone-Acetaminophen 5-500 Mg Tabs (Hydrocodone-Acetaminophen) .... 1/2 Tablet As Needed  Allergies (verified): No Known Drug Allergies  Past History:  Past Medical History: Last updated: 05/25/2010 Hx of INTERNAL HEMORRHOIDS (ICD-455.0) Hx of DIVERTICULOSIS, COLON (ICD-562.10) COLONIC POLYPS (ICD-211.3) VERTIGO, HX OF (ICD-V12.49) CAROTID STENOSIS BILATERAL (ICD-433.10)...mild... Doppler... August, 2009  /  0-39% bilateral... Doppler.. August, 2011 SKIN CANCER, HX OF (ICD-V10.83) CORONARY ARTERY DISEASE MILD (ICD-414.00)...question coronary artery spasm in the past. MITRAL REGURGITATION, MILD (ICD-396.3) COUMADIN THERAPY (ICD-V58.61) ZENKER'S DIVERTICULUM (ICD-530.6) ALLERGIC RHINITIS (ICD-477.9) ATRIAL FIBRILLATION, CHRONIC (ICD-427.31) PLANTAR FASCIITIS, RIGHT (ICD-728.71) EF 55% .. echo.. march, 2007... improved from prior LV dysfunction Tricuspid regurgitation mild to moderate... echo... march 2007 Volume overload... episode October, 2010 shortness of breath from not taking her meds for 3 days stabilized. History SCHAMBERG purpura (progressive pigmentary purpura) skin changes on legs from Coumadin History of leg discomfort but normal leg Dopplers multiple fractures from fall 2005 stabilized orthostatic hypotension...mild vertigo....mild hoarseness.Marland KitchenENT evaluation.. Dr.Shoemaker.. march,  2010... no masses found.. no cord dysfunction.... treated with PPI and other reflux measures and followup at [facility] as needed we're prior surgery for Zenker's diverticulum has been done MIXED HEARING LOSS BILATERAL (ICD-389.22) FIBROMYALGIA (ICD-729.1) CHRONIC FOLLICULAR CONJUNCTIVITIS (ICD-372.12) Eyelid surgery TIA   September, 2011  ... while patient was off Coumadin briefly...head CT no acute abnormality... 2-D echo normal LV function... symptoms resolved completely... patient back on Coumadin EF  60%... echo.. September, 2011    Past Surgical History: Last updated: 09/17/2007 * EYELID SURGERY * SHOULDER SURGERY * ZENKER DIVERTICULAR REPAIR X2 FOOT SURGERY, HX OF (ICD-V15.89) INTRAOCULAR LENS IMPLANT, LEFT EYE, HX OF (ICD-V43.1) CATARACT EXTRACTION, LEFT EYE, HX OF (ICD-V45.61) HEMORRHOIDECTOMY, HX OF (ICD-V45.89) INGUINAL HERNIORRHAPHY, RIGHT, HX OF (ICD-V45.89) TONSILLECTOMY, HX OF (ICD-V45.79)  Family History: Last updated: 05/05/2009 No FH of Colon Cancer: Non-contributrory  Social History: Last updated: 05/05/2009 married '42- '85 - widowed 1 son '51; 1 grandchild Lives alone- I-ADLs including driving.  End-of-Life : No DPR, No mechanical, no heroic measures,  i.e. artificial feeding, etc.  Patient is a former smoker.  Alcohol Use - yes Daily Caffeine Use  Review of Systems       The patient complains of weight loss and difficulty walking.  The patient denies anorexia, fever, chest pain, dyspnea on exertion, abdominal pain, and abnormal bleeding.    Physical Exam  General:  ver elderly white woman who is uncomfortable Head:  normocephalic and atraumatic.   Eyes:  C&S clear Lungs:  normal respiratory effort.   Heart:  normal rate and regular rhythm.   Msk:  Patient uncomfortable with standing Neurologic:  alert & oriented X3 and cranial nerves II-XII intact.   Psych:  normally interactive, good eye contact, and not anxious appearing.     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 Per HPI - discussed situation with Dr. Otelia Sergeant who feels she has a good chance of benefitting from [facility]. Patient is willing.  Plan - called coumadin clinic: they will call the patient and set up  a schedule for coming of coumadin and bridging with lovenox.            Dr. Barbaraann Faster office has her on the schedule for [facility] Feb 8th at 10:00 AM.           Continue present pain medication and retry gabapentin starting at 100mg  at bedtime and workng up to 100mg  three times a day.   Complete Medication List: 1)  Coumadin 5 Mg Tabs (Warfarin sodium) .... Take as directed by coumadin clinic. 2)  Glucosamine Chondroitin Complx Tabs (Glucosamine-chondroit-vit c-mn) .... Take once daily 3)  B Complex Tabs (B complex vitamins) .... Take once daily 4)  Selenium 50 Mcg Tabs (Selenium) .... Take once daily 5)  Ambien 5 Mg Tabs (Zolpidem tartrate) .... Take 1/4 tablet at bedtime 6)  Coq10  .... Take once daily 7)  Calcium Carbonate 1250 Mg/60ml Susp (Calcium carbonate) .... 2 tablespoons daily 8)  Carvedilol 6.25 Mg Tabs (Carvedilol) .... Take one tablet two times a day 9)  Vitamin C Cr 1000 Mg Cr-tabs (Ascorbic acid) .Marland Kitchen.. 1 tablet by mouth three times a day 10)  Furosemide 40 Mg Tabs (Furosemide) .Marland Kitchen.. 1 tablet by mouth once daily 11)  Vitamin D 1000 Unit Tabs (Cholecalciferol) .... 2 tabs daily 12)  Hydrocodone-acetaminophen 5-500 Mg Tabs (Hydrocodone-acetaminophen) .... 1/2 tablet as needed 13)  Gabapentin  100 Mg Caps (Gabapentin) .Marland Kitchen.. 1 at bedtime x 2 days, 1 two times a day x 2 days then 1 three times a day for relief of back pain. 14)  Enoxaparin Sodium 60 Mg/0.57ml Soln (Enoxaparin sodium) .... Inject 60mg s subcutaneously daily every 24 hours  Patient Instructions: 1)  Back pain - Dr. Otelia Sergeant recommends a repeat epidural steroid injection. the coumadin clinic will call you soon to give you instructions as to when to stop the coumadin and when to start taking lovenox shots. You are scheduled for an epidural steroid injection Wednsday, Feb 8th at 10:00AM 2)  Pain control - take the hydrocodone as instructed. Take gabapentin 100mg  at bedtime x 2 days then two times a day x 2 days then three times a day to  help with pain relief.  Prescriptions: GABAPENTIN 100 MG CAPS (GABAPENTIN) 1 at bedtime x 2 days, 1 two times a day x 2 days then 1 three times a day for relief of back pain.  #60 x 2   Entered and Authorized by:   Jacques Navy MD   Signed by:   Jacques Navy MD on 08/07/2010   Method used:   Electronically to        [facility] [phone]* (retail)       [address]       [place], [place]  [address]       Ph: [phone]       Fax: [phone]   RxID:   [id number]    Orders Added: 1)  Est. Patient Level III [44010]

## 2010-08-15 NOTE — Progress Notes (Signed)
Summary: needs epidural in back  Phone Note Call from Patient   Caller: Patient Summary of Call: pt called and states she is having a lot of back pain, last time she was off coumadin for 5 days and had an epidural and then had a TIA so she doesn't want to stop her couamdin again, per Dr Myrtis Ser last OV pt will need Lovenxo bridge if her coumadin has to be held, will discuss w/cvrr and call pt back  Initial call taken by: Meredith Staggers, RN,  August 03, 2010 10:02 AM  Follow-up for Phone Call        Agree, needs lovenox bridge.  Talitha Givens, MD, [facility]  August 07, 2010 1:58 PM  pt aware, shot sch for 2/8, will let Kennon Rounds know to arrange Meredith Staggers, RN  August 07, 2010 5:48 PM      Appended Document: needs epidural in back Pt to take last dose of coumadin on 08/10/10. Start Lovenox 60mg s Subcutaneously on 08/12/10 q24hrs between 8am-9am.  Take last injection on 08/14/10 before 9am. Come into office on 08/15/10 for INR. lovenox Called into [facility] on [address] per pt. request.

## 2010-08-20 ENCOUNTER — Encounter (INDEPENDENT_AMBULATORY_CARE_PROVIDER_SITE_OTHER): Payer: Medicare Other

## 2010-08-20 ENCOUNTER — Encounter: Payer: Self-pay | Admitting: Cardiology

## 2010-08-20 DIAGNOSIS — Z7901 Long term (current) use of anticoagulants: Secondary | ICD-10-CM

## 2010-08-20 DIAGNOSIS — I4891 Unspecified atrial fibrillation: Secondary | ICD-10-CM

## 2010-08-22 ENCOUNTER — Telehealth: Payer: Self-pay | Admitting: Internal Medicine

## 2010-08-23 NOTE — Medication Information (Signed)
Summary: Coumadin Clinic  Anticoagulant Therapy  Managed by: Weston Brass, PharmD Referring MD: Willa Rough MD PCP: Illene Regulus, MD Supervising MD: Shirlee Latch MD, Zakiah Beckerman Indication 1: Atrial Fibrillation (ICD-427.31) Lab Used: [facility] Site: [facility] INR POC 1.2 INR RANGE 2 - 3  Dietary changes: no    Health status changes: no    Bleeding/hemorrhagic complications: no    Recent/future hospitalizations: no    Any changes in medication regimen? yes       Details: On Lovenox for injection  Recent/future dental: no  Any missed doses?: yes     Details: holding Coumadin for spinal injection today  Is patient compliant with meds? yes       Allergies: No Known Drug Allergies  Anticoagulation Management History:      The patient is taking warfarin and comes in today for a routine follow up visit.  Positive risk factors for bleeding include an age of 75 years or older and history of CVA/TIA.  The bleeding index is 'intermediate risk'.  Positive CHADS2 values include Age > 75 years old and Prior Stroke/CVA/TIA.  The start date was 03/20/1998.  Anticoagulation responsible provider: Shirlee Latch MD, Zahli Vetsch.  INR POC: 1.2.  Exp: 05/2011.    Anticoagulation Management Assessment/Plan:      The patient's current anticoagulation dose is Coumadin 5 mg  tabs: Take as directed by coumadin clinic..  The target INR is 2 - 3.  The next INR is due 08/27/2010.  Anticoagulation instructions were given to patient.  Results were reviewed/authorized by Weston Brass, PharmD.  She was notified by Weston Brass PharmD.         Prior Anticoagulation Instructions: INR 2.5  Coumadin 5mg  tabs Take 1 tab each day EXCEPT 1/2 tab on THUR  Current Anticoagulation Instructions: INR 1.2  Take 1 1/2 tablets of Coumadin today, 1 tablet tomorrow, then resume same dose of 1 tablet every day except 1/2 tablet on Thursday.  Restart Lovenox 60mg  once daily on Thursday.  Continue until next INR check.

## 2010-08-23 NOTE — Letter (Addendum)
Summary: [facility]   Imported By: [address] 08/16/2010 09:04:05  _____________________________________________________________________  External Attachment:    Type:   Image     Comment:   External Document

## 2010-08-24 ENCOUNTER — Encounter: Payer: Self-pay | Admitting: Internal Medicine

## 2010-08-27 ENCOUNTER — Encounter: Payer: Self-pay | Admitting: Cardiology

## 2010-08-27 DIAGNOSIS — G459 Transient cerebral ischemic attack, unspecified: Secondary | ICD-10-CM

## 2010-08-27 DIAGNOSIS — I4891 Unspecified atrial fibrillation: Secondary | ICD-10-CM

## 2010-08-28 ENCOUNTER — Ambulatory Visit (INDEPENDENT_AMBULATORY_CARE_PROVIDER_SITE_OTHER): Payer: Medicare Other | Admitting: Internal Medicine

## 2010-08-28 ENCOUNTER — Encounter: Payer: Self-pay | Admitting: Internal Medicine

## 2010-08-28 ENCOUNTER — Encounter: Payer: Self-pay | Admitting: Cardiology

## 2010-08-28 ENCOUNTER — Encounter: Payer: Self-pay | Admitting: Cardiovascular Disease

## 2010-08-28 ENCOUNTER — Ambulatory Visit (INDEPENDENT_AMBULATORY_CARE_PROVIDER_SITE_OTHER): Payer: Medicare Other | Admitting: Cardiology

## 2010-08-28 ENCOUNTER — Encounter (INDEPENDENT_AMBULATORY_CARE_PROVIDER_SITE_OTHER): Payer: Medicare Other

## 2010-08-28 DIAGNOSIS — I4891 Unspecified atrial fibrillation: Secondary | ICD-10-CM

## 2010-08-28 DIAGNOSIS — M5137 Other intervertebral disc degeneration, lumbosacral region: Secondary | ICD-10-CM

## 2010-08-28 DIAGNOSIS — Z7901 Long term (current) use of anticoagulants: Secondary | ICD-10-CM

## 2010-08-29 NOTE — Medication Information (Signed)
Summary: Coumadin Clinic  Anticoagulant Therapy  Managed by: Georgina Pillion, PharmD Referring MD: Willa Rough MD PCP: Illene Regulus, MD Supervising MD: Jens Som MD, Arlys John Indication 1: Atrial Fibrillation (ICD-427.31) Lab Used[facility]  Site: [facility] INR POC 2.1 INR RANGE 2 - 3  Dietary changes: no    Health status changes: no    Bleeding/hemorrhagic complications: no    Recent/future hospitalizations: no    Any changes in medication regimen? yes       Details: Gabapentin for nerve pain,   Recent/future dental: no  Any missed doses?: no       Is patient compliant with meds? yes       Allergies: No Known Drug Allergies  Anticoagulation Management History:      Positive risk factors for bleeding include an age of 15 years or older and history of CVA/TIA.  The bleeding index is 'intermediate risk'.  Positive CHADS2 values include Age > 68 years old and Prior Stroke/CVA/TIA.  The start date was 03/20/1998.  Anticoagulation responsible provider: Jens Som MD, Arlys John.  INR POC: 2.1.  Cuvette Lot#: [id number].  Exp: 07/2011.    Anticoagulation Management Assessment/Plan:      The patient's current anticoagulation dose is Coumadin 5 mg  tabs: Take as directed by coumadin clinic..  The target INR is 2 - 3.  The next INR is due 08/28/2010.  Anticoagulation instructions were given to patient.  Results were reviewed/authorized by Georgina Pillion, PharmD.  She was notified by Georgina Pillion PharmD.         Prior Anticoagulation Instructions: INR 1.2  Take 1 1/2 tablets of Coumadin today, 1 tablet tomorrow, then resume same dose of 1 tablet every day except 1/2 tablet on Thursday.  Restart Lovenox 60mg  once daily on Thursday.  Continue until next INR check.   Current Anticoagulation Instructions: Continue current regimen of 1 tablet (5 mg) daily EXCEPT for 1/2 tablet (2.5 mg) on Thursdays only.  INR 2.1

## 2010-08-29 NOTE — Progress Notes (Signed)
Summary: PAIN  Phone Note Call from Patient Call back at Home Phone [phone]   Caller: Patient Summary of Call: Patient called [facility] stating that she is having continued pain. Per messg, she has received an injection (did not mention what) but still has pain.Alvy Beal Archie CMA  August 22, 2010 5:17 PM   Pt had ESI last wednesday and had NO relief. ESI in september did give some relief.  Heating pad has helped some when sitting but can not move around w/out pain. Should she go back to ortho? Should she see a neurologist?  Pt is taking gabapentin 1 hs but can not take more than one hs b/c it causes her to be off balance and very "loopy" feeling. Her fear of falling has kept her from taking more.  Hydrocodone helps but says med causes severe constipation which she prunes to balance this. She will continue to take the gabapentin at bedtime b/c it helps w/pain relief and she sleeps all night. Also will continue hydrocodone as needed but wants MD's suggestion b/c ESI did not help.  Initial call taken by: Lamar Sprinkles, CMA,  August 22, 2010 6:05 PM  Follow-up for Phone Call        May take gabapentin two times a day x 3 days and then three times a day. OK to continue vicodin carefully. Her choice re: follow-up with me or ortho.  Follow-up by: Jacques Navy MD,  August 23, 2010 1:15 PM  Additional Follow-up for Phone Call Additional follow up Details #1::        Pt unable to take more than one gabapentin hs due to s/e of meds. She has tried to slowly increase but unable. She wouldl ike to f/u w/Dr Petar Mucci - transferred to scheduler Additional Follow-up by: Lamar Sprinkles, CMA,  August 23, 2010 4:02 PM

## 2010-08-30 ENCOUNTER — Telehealth: Payer: Self-pay | Admitting: Internal Medicine

## 2010-09-04 NOTE — Medication Information (Signed)
Summary: rov/pc  Anticoagulant Therapy  Managed by: Windell Hummingbird, RN Referring MD: Willa Rough MD PCP: Illene Regulus, MD Supervising MD: Excell Seltzer MD, Casimiro Needle Indication 1: Atrial Fibrillation (ICD-427.31) Lab Used: [facility] Site: [facility] INR POC 2.2 INR RANGE 2 - 3  Dietary changes: no    Health status changes: no    Bleeding/hemorrhagic complications: no    Recent/future hospitalizations: no    Any changes in medication regimen? no    Recent/future dental: no  Any missed doses?: no       Is patient compliant with meds? yes       Allergies: No Known Drug Allergies  Anticoagulation Management History:      The patient is taking warfarin and comes in today for a routine follow up visit.  Positive risk factors for bleeding include an age of 95 years or older and history of CVA/TIA.  The bleeding index is 'intermediate risk'.  Positive CHADS2 values include Age > 62 years old and Prior Stroke/CVA/TIA.  The start date was 03/20/1998.  Anticoagulation responsible provider: Excell Seltzer MD, Casimiro Needle.  INR POC: 2.2.  Cuvette Lot#: [id number].  Exp: 07/2011.    Anticoagulation Management Assessment/Plan:      The patient's current anticoagulation dose is Coumadin 5 mg  tabs: Take as directed by coumadin clinic..  The target INR is 2 - 3.  The next INR is due 09/26/2010.  Anticoagulation instructions were given to patient.  Results were reviewed/authorized by Windell Hummingbird, RN.  She was notified by Windell Hummingbird, RN.         Prior Anticoagulation Instructions: Continue current regimen of 1 tablet (5 mg) daily EXCEPT for 1/2 tablet (2.5 mg) on Thursdays only.  INR 2.1  Current Anticoagulation Instructions: INR 2.2 Continue taking 1 tablet every day, except take 1/2 tablet on Thursdays. Recheck in 4 weeks.

## 2010-09-04 NOTE — Miscellaneous (Signed)
  Clinical Lists Changes  Observations: Added new observation of PAST MED HX: Hx of INTERNAL HEMORRHOIDS (ICD-455.0) Hx of DIVERTICULOSIS, COLON (ICD-562.10) COLONIC POLYPS (ICD-211.3) VERTIGO, HX OF (ICD-V12.49) CAROTID STENOSIS BILATERAL (ICD-433.10)...mild... Doppler... August, 2009  /  0-39% bilateral... Doppler.. August, 2011 SKIN CANCER, HX OF (ICD-V10.83) CORONARY ARTERY DISEASE MILD (ICD-414.00)...question coronary artery spasm in the past. MITRAL REGURGITATION, MILD (ICD-396.3) COUMADIN THERAPY (ICD-V58.61) ZENKER'S DIVERTICULUM (ICD-530.6) ALLERGIC RHINITIS (ICD-477.9) ATRIAL FIBRILLATION, CHRONIC (ICD-427.31) PLANTAR FASCIITIS, RIGHT (ICD-728.71) EF 55% .. echo.. march, 2007... improved from prior LV dysfunction  /   EF 60%... echo.. September, 2011 Tricuspid regurgitation mild to moderate... echo... march 2007 Volume overload... episode October, 2010 shortness of breath from not taking her meds for 3 days stabilized. History SCHAMBERG purpura (progressive pigmentary purpura) skin changes on legs from Coumadin History of leg discomfort but normal leg Dopplers multiple fractures from fall 2005 stabilized orthostatic hypotension...mild vertigo....mild hoarseness.Marland KitchenENT evaluation.. Dr.Shoemaker.. march, 2010... no masses found.. no cord dysfunction.... treated with PPI and other reflux measures and followup at [facility] as needed we're prior surgery for Zenker's diverticulum has been done MIXED HEARING LOSS BILATERAL (ICD-389.22) FIBROMYALGIA (ICD-729.1) CHRONIC FOLLICULAR CONJUNCTIVITIS (ICD-372.12) Eyelid surgery TIA   September, 2011 ... while patient was off Coumadin briefly...head CT no acute abnormality... 2-D echo normal LV function... symptoms resolved completely... patient back on Coumadin    (08/27/2010 16:12) Added new observation of PRIMARY MD: Illene Regulus, MD (08/27/2010 16:12)       Past History:  Past Medical History: Hx of INTERNAL HEMORRHOIDS  (ICD-455.0) Hx of DIVERTICULOSIS, COLON (ICD-562.10) COLONIC POLYPS (ICD-211.3) VERTIGO, HX OF (ICD-V12.49) CAROTID STENOSIS BILATERAL (ICD-433.10)...mild... Doppler... August, 2009  /  0-39% bilateral... Doppler.. August, 2011 SKIN CANCER, HX OF (ICD-V10.83) CORONARY ARTERY DISEASE MILD (ICD-414.00)...question coronary artery spasm in the past. MITRAL REGURGITATION, MILD (ICD-396.3) COUMADIN THERAPY (ICD-V58.61) ZENKER'S DIVERTICULUM (ICD-530.6) ALLERGIC RHINITIS (ICD-477.9) ATRIAL FIBRILLATION, CHRONIC (ICD-427.31) PLANTAR FASCIITIS, RIGHT (ICD-728.71) EF 55% .. echo.. march, 2007... improved from prior LV dysfunction  /   EF 60%... echo.. September, 2011 Tricuspid regurgitation mild to moderate... echo... march 2007 Volume overload... episode October, 2010 shortness of breath from not taking her meds for 3 days stabilized. History SCHAMBERG purpura (progressive pigmentary purpura) skin changes on legs from Coumadin History of leg discomfort but normal leg Dopplers multiple fractures from fall 2005 stabilized orthostatic hypotension...mild vertigo....mild hoarseness.Marland KitchenENT evaluation.. Dr.Shoemaker.. march, 2010... no masses found.. no cord dysfunction.... treated with PPI and other reflux measures and followup at [facility] as needed we're prior surgery for Zenker's diverticulum has been done MIXED HEARING LOSS BILATERAL (ICD-389.22) FIBROMYALGIA (ICD-729.1) CHRONIC FOLLICULAR CONJUNCTIVITIS (ICD-372.12) Eyelid surgery TIA   September, 2011 ... while patient was off Coumadin briefly...head CT no acute abnormality... 2-D echo normal LV function... symptoms resolved completely... patient back on Coumadin

## 2010-09-04 NOTE — Assessment & Plan Note (Signed)
Summary: back pain--30 min per [name]   Vital Signs:  Patient profile:   77 year old female Height:      61.5 inches (156.21 cm) Weight:      114.50 pounds (52.05 kg) BMI:     21.36 O2 Sat:      90 % on Room air Temp:     97.4 degrees F (36.33 degrees C) oral Pulse rate:   84 / minute Resp:     16 per minute BP sitting:   128 / 82  (left arm) Cuff size:   regular  Vitals Entered By: Burnard Leigh CMA(AAMA) (August 28, 2010 4:28 PM)  O2 Flow:  Room air CC: Pt c/o pain in scaitic nerve from left gluteus radiating down leg.Pt c/o pain & soreness in Right breast/has found lump below nipple area/sls,cma Is Patient Diabetic? No   Primary Care Provider:  Illene Regulus, MD  CC:  Pt c/o pain in scaitic nerve from left gluteus radiating down leg.Pt c/o pain & soreness in Right breast/has found lump below nipple area/sls and cma.  History of Present Illness: [name] has chronic back pain and has been followed by Dr. Otelia Sergeant. She has had steroid injections which have failed to relieve her pain. She has on going discomfort and is seeking advice about possible treatment. She is ambulatory, has normal continence, denies marked paresthesia.  Her pain does radiate down her leg. She is not a candidate for surgery and is reticent to use narcotics. Her condition has not changed significantly but is wearing her down. Her other medical problems remain stable.   Current Medications (verified): 1)  Coumadin 5 Mg  Tabs (Warfarin Sodium) .... Take As Directed By Coumadin Clinic. 2)  Glucosamine Chondroitin Complx   Tabs (Glucosamine-Chondroit-Vit C-Mn) .... Take Once Daily 3)  B Complex   Tabs (B Complex Vitamins) .... Take Once Daily 4)  Selenium 50 Mcg  Tabs (Selenium) .... Take Once Daily 5)  Ambien 5 Mg  Tabs (Zolpidem Tartrate) .... Take 1/4 Tablet At Bedtime 6)  Coq10 .... Take Once Daily 7)  Calcium Carbonate 1250 Mg/63ml Susp (Calcium Carbonate) .... 2 Tablespoons Daily 8)  Carvedilol 6.25 Mg   Tabs (Carvedilol) .... Take One Tablet Two Times A Day 9)  Vitamin C Cr 1000 Mg Cr-Tabs (Ascorbic Acid) .Marland Kitchen.. 1 Tablet By Mouth Three Times A Day 10)  Furosemide 40 Mg Tabs (Furosemide) .Marland Kitchen.. 1 Tablet By Mouth Once Daily 11)  Vitamin D 1000 Unit Tabs (Cholecalciferol) .... 2 Tabs Daily 12)  Gabapentin 100 Mg Caps (Gabapentin) .... As Needed  Allergies (verified): No Known Drug Allergies  Past History:  Past Medical History: Last updated: 08/27/2010 Hx of INTERNAL HEMORRHOIDS (ICD-455.0) Hx of DIVERTICULOSIS, COLON (ICD-562.10) COLONIC POLYPS (ICD-211.3) VERTIGO, HX OF (ICD-V12.49) CAROTID STENOSIS BILATERAL (ICD-433.10)...mild... Doppler... August, 2009  /  0-39% bilateral... Doppler.. August, 2011 SKIN CANCER, HX OF (ICD-V10.83) CORONARY ARTERY DISEASE MILD (ICD-414.00)...question coronary artery spasm in the past. MITRAL REGURGITATION, MILD (ICD-396.3) COUMADIN THERAPY (ICD-V58.61) ZENKER'S DIVERTICULUM (ICD-530.6) ALLERGIC RHINITIS (ICD-477.9) ATRIAL FIBRILLATION, CHRONIC (ICD-427.31) PLANTAR FASCIITIS, RIGHT (ICD-728.71) EF 55% .. echo.. march, 2007... improved from prior LV dysfunction  /   EF 60%... echo.. September, 2011 Tricuspid regurgitation mild to moderate... echo... march 2007 Volume overload... episode October, 2010 shortness of breath from not taking her meds for 3 days stabilized. History SCHAMBERG purpura (progressive pigmentary purpura) skin changes on legs from Coumadin History of leg discomfort but normal leg Dopplers multiple fractures from fall 2005 stabilized orthostatic hypotension...mild  vertigo....mild hoarseness.Marland KitchenENT evaluation.. Dr.Shoemaker.. march, 2010... no masses found.. no cord dysfunction.... treated with PPI and other reflux measures and followup at [facility] as needed we're prior surgery for Zenker's diverticulum has been done MIXED HEARING LOSS BILATERAL (ICD-389.22) FIBROMYALGIA (ICD-729.1) CHRONIC FOLLICULAR CONJUNCTIVITIS (ICD-372.12) Eyelid  surgery TIA   September, 2011 ... while patient was off Coumadin briefly...head CT no acute abnormality... 2-D echo normal LV function... symptoms resolved completely... patient back on Coumadin     Past Surgical History: Last updated: 09/17/2007 * EYELID SURGERY * SHOULDER SURGERY * ZENKER DIVERTICULAR REPAIR X2 FOOT SURGERY, HX OF (ICD-V15.89) INTRAOCULAR LENS IMPLANT, LEFT EYE, HX OF (ICD-V43.1) CATARACT EXTRACTION, LEFT EYE, HX OF (ICD-V45.61) HEMORRHOIDECTOMY, HX OF (ICD-V45.89) INGUINAL HERNIORRHAPHY, RIGHT, HX OF (ICD-V45.89) TONSILLECTOMY, HX OF (ICD-V45.79) FH reviewed for relevance, SH/Risk Factors reviewed for relevance  Review of Systems  The patient denies anorexia, fever, weight loss, decreased hearing, chest pain, syncope, dyspnea on exertion, abdominal pain, severe indigestion/heartburn, incontinence, muscle weakness, and enlarged lymph nodes.    Physical Exam  General:  alert, well-developed, well-nourished, and well-hydrated and looking younger than her stated age. .   Head:  normocephalic and atraumatic.   Eyes:  vision grossly intact, pupils equal, pupils round, and corneas and lenses clear.   Neck:  supple.   Lungs:  normal respiratory effort.   Heart:  normal rate and regular rhythm.   Msk:  no joint tenderness, no joint swelling, no joint warmth, and no joint instability.   Pulses:  2+ radial Neurologic:  alert & oriented X3, cranial nerves II-XII intact, and gait normal.   Skin:  turgor normal and color normal.   Psych:  Oriented X3, memory intact for recent and remote, normally interactive, and good eye contact.     Impression & Recommendations:  Problem # 1:  DEGENERATIVE DISC DISEASE, LUMBAR SPINE (ICD-722.52) persistent sciatica s/p ESI which have not helped todate. Dr. Otelia Sergeant feels she should try another injection. She is undecided.   Plan - no change in her regimen at this time  Complete Medication List: 1)  Coumadin 5 Mg Tabs (Warfarin  sodium) .... Take as directed by coumadin clinic. 2)  Glucosamine Chondroitin Complx Tabs (Glucosamine-chondroit-vit c-mn) .... Take once daily 3)  B Complex Tabs (B complex vitamins) .... Take once daily 4)  Selenium 50 Mcg Tabs (Selenium) .... Take once daily 5)  Ambien 5 Mg Tabs (Zolpidem tartrate) .... Take 1/4 tablet at bedtime 6)  Coq10  .... Take once daily 7)  Calcium Carbonate 1250 Mg/49ml Susp (Calcium carbonate) .... 2 tablespoons daily 8)  Carvedilol 6.25 Mg Tabs (Carvedilol) .... Take one tablet two times a day 9)  Vitamin C Cr 1000 Mg Cr-tabs (Ascorbic acid) .Marland Kitchen.. 1 tablet by mouth three times a day 10)  Furosemide 40 Mg Tabs (Furosemide) .Marland Kitchen.. 1 tablet by mouth once daily 11)  Vitamin D 1000 Unit Tabs (Cholecalciferol) .... 2 tabs daily 12)  Gabapentin 100 Mg Caps (Gabapentin) .... As needed   Orders Added: 1)  Est. Patient Level II [72536]     Preventive Care Screening  Mammogram:    Date:  08/08/2009    Results:  normal   Pap Smear:    Date:  08/08/2009    Results:  normal

## 2010-09-04 NOTE — Assessment & Plan Note (Signed)
Summary: follow up/sl/rs from [facility]   Visit Type:  Follow-up Primary Provider:  Illene Regulus, MD  CC:  atrial fibrillation.  History of Present Illness: The patient is seen for followup of atrial fibrillation.  She's not having any significant palpitations.  She has some chest discomfort that seems to be musculoskeletal.  It occurs in the right anterior chest with movement.  She has not any syncope or presyncope.  Current Medications (verified): 1)  Coumadin 5 Mg  Tabs (Warfarin Sodium) .... Take As Directed By Coumadin Clinic. 2)  Glucosamine Chondroitin Complx   Tabs (Glucosamine-Chondroit-Vit C-Mn) .... Take Once Daily 3)  B Complex   Tabs (B Complex Vitamins) .... Take Once Daily 4)  Selenium 50 Mcg  Tabs (Selenium) .... Take Once Daily 5)  Ambien 5 Mg  Tabs (Zolpidem Tartrate) .... Take 1/4 Tablet At Bedtime 6)  Coq10 .... Take Once Daily 7)  Calcium Carbonate 1250 Mg/70ml Susp (Calcium Carbonate) .... 2 Tablespoons Daily 8)  Carvedilol 6.25 Mg  Tabs (Carvedilol) .... Take One Tablet Two Times A Day 9)  Vitamin C Cr 1000 Mg Cr-Tabs (Ascorbic Acid) .Marland Kitchen.. 1 Tablet By Mouth Three Times A Day 10)  Furosemide 40 Mg Tabs (Furosemide) .Marland Kitchen.. 1 Tablet By Mouth Once Daily 11)  Vitamin D 1000 Unit Tabs (Cholecalciferol) .... 2 Tabs Daily 12)  Gabapentin 100 Mg Caps (Gabapentin) .... As Needed  Allergies (verified): No Known Drug Allergies  Past History:  Past Medical History: Hx of INTERNAL HEMORRHOIDS (ICD-455.0) Hx of DIVERTICULOSIS, COLON (ICD-562.10) COLONIC POLYPS (ICD-211.3) VERTIGO, HX OF (ICD-V12.49) CAROTID STENOSIS BILATERAL (ICD-433.10)...mild... Doppler... August, 2009  /  0-39% bilateral... Doppler.. August, 2011 SKIN CANCER, HX OF (ICD-V10.83) CORONARY ARTERY DISEASE MILD (ICD-414.00)...question coronary artery spasm in the past. MITRAL REGURGITATION, MILD (ICD-396.3) COUMADIN THERAPY (ICD-V58.61) ZENKER'S DIVERTICULUM (ICD-530.6) ALLERGIC RHINITIS  (ICD-477.9) ATRIAL FIBRILLATION, CHRONIC (ICD-427.31) PLANTAR FASCIITIS, RIGHT (ICD-728.71) EF 55% .. echo.. march, 2007... improved from prior LV dysfunction  /   EF 60%... echo.. September, 2011 Tricuspid regurgitation mild to moderate... echo... march 2007 Volume overload... episode October, 2010 shortness of breath from not taking her meds for 3 days stabilized. History SCHAMBERG purpura (progressive pigmentary purpura) skin changes on legs from Coumadin History of leg discomfort but normal leg Dopplers multiple fractures from fall 2005 stabilized orthostatic hypotension...mild vertigo....mild hoarseness.Marland KitchenENT evaluation.. Dr.Shoemaker.. march, 2010... no masses found.. no cord dysfunction.... treated with PPI and other reflux measures and followup at [facility] as needed we're prior surgery for Zenker's diverticulum has been done MIXED HEARING LOSS BILATERAL (ICD-389.22) FIBROMYALGIA (ICD-729.1) CHRONIC FOLLICULAR CONJUNCTIVITIS (ICD-372.12) Eyelid surgery TIA   September, 2011 ... while patient was off Coumadin briefly...head CT no acute abnormality... 2-D echo normal LV function... symptoms resolved completely... patient back on Coumadin Stress   related to taking care of her sister   February, 2012     Review of Systems       Patient denies fever, chills, headache, sweats, rash, change in vision, change in hearing, cough, nausea vomiting, urinary symptoms.  All other systems are reviewed and are negative.  Vital Signs:  Patient profile:   77 year old female Height:      61.5 inches Weight:      113 pounds BMI:     21.08 Pulse rate:   60 / minute BP sitting:   104 / 60  (left arm) Cuff size:   regular  Vitals Entered By: Hardin Negus, RMA (August 28, 2010 11:09 AM)  Physical Exam  General:  patient  is stable today. Head:  head is atraumatic. Eyes:  no xanthelasma. Neck:  no jugular venous distention. Lungs:  lungs are clear.  Respiratory effort is nonlabored. Heart:   cardiac exam reveals S1 and S2.  There is a soft systolic murmur. Abdomen:  abdomen is soft. Extremities:  no peripheral edema. Psych:  patient is oriented to person time and place.  Affect is normal.   Impression & Recommendations:  Problem # 1:  TIA (ICD-435.9) Patient is fully recovered from her TIA.  This occurred when she was off Coumadin.  Problem # 2:  DYSPNEA (ICD-786.05) She is not having any significant shortness of breath.  Problem # 3:  FLUID OVERLOAD (ICD-276.6) Fluid status is stable.  No change in therapy.  Problem # 4:  * STRESS  HELPING SISTER The patient's major problem at this time is the enormous stress that she has trying to help her sister who is now widowed. For a long time she has been looking into moving to [place] in a facility near her son.  I have encouraged her to do this.  Her sister has family who can take care of her.  Patient Instructions: 1)  Follow up in 3 months.

## 2010-09-06 ENCOUNTER — Encounter (INDEPENDENT_AMBULATORY_CARE_PROVIDER_SITE_OTHER): Payer: Medicare Other

## 2010-09-06 ENCOUNTER — Encounter: Payer: Self-pay | Admitting: Internal Medicine

## 2010-09-06 DIAGNOSIS — Z7901 Long term (current) use of anticoagulants: Secondary | ICD-10-CM

## 2010-09-06 DIAGNOSIS — I4891 Unspecified atrial fibrillation: Secondary | ICD-10-CM

## 2010-09-06 LAB — CONVERTED CEMR LAB: POC INR: 1

## 2010-09-13 ENCOUNTER — Encounter: Payer: Self-pay | Admitting: Internal Medicine

## 2010-09-13 ENCOUNTER — Ambulatory Visit (INDEPENDENT_AMBULATORY_CARE_PROVIDER_SITE_OTHER): Payer: Medicare Other | Admitting: Internal Medicine

## 2010-09-13 ENCOUNTER — Encounter (INDEPENDENT_AMBULATORY_CARE_PROVIDER_SITE_OTHER): Payer: Medicare Other

## 2010-09-13 DIAGNOSIS — R0602 Shortness of breath: Secondary | ICD-10-CM

## 2010-09-13 DIAGNOSIS — Z7901 Long term (current) use of anticoagulants: Secondary | ICD-10-CM

## 2010-09-13 DIAGNOSIS — I4891 Unspecified atrial fibrillation: Secondary | ICD-10-CM

## 2010-09-13 LAB — CONVERTED CEMR LAB: POC INR: 1.7

## 2010-09-13 NOTE — Medication Information (Signed)
Summary: rov/pc  Anticoagulant Therapy  Managed by: Cloyde Reams, RN, BSN Referring MD: Willa Rough MD PCP: Illene Regulus, MD Supervising MD: Tenny Craw MD, Gunnar Fusi Indication 1: Atrial Fibrillation (ICD-427.31) Lab Used: [facility] Site: [facility] INR POC 1.0 INR RANGE 2 - 3  Dietary changes: no    Health status changes: no    Bleeding/hemorrhagic complications: yes    Recent/future hospitalizations: no    Any changes in medication regimen? no    Recent/future dental: no  Any missed doses?: yes     Details: Off Coumadin since 09/01/10, presently on Lovenox bridge.  No Lovenox today prior to epidural.    Is patient compliant with meds? yes       Allergies: No Known Drug Allergies  Anticoagulation Management History:      The patient is taking warfarin and comes in today for a routine follow up visit.  Positive risk factors for bleeding include an age of 75 years or older and history of CVA/TIA.  The bleeding index is 'intermediate risk'.  Positive CHADS2 values include Age > 57 years old and Prior Stroke/CVA/TIA.  The start date was 03/20/1998.  Anticoagulation responsible provider: Tenny Craw MD, Gunnar Fusi.  INR POC: 1.0.  Cuvette Lot#: [id number].  Exp: 07/2011.    Anticoagulation Management Assessment/Plan:      The patient's current anticoagulation dose is Coumadin 5 mg  tabs: Take as directed by coumadin clinic..  The target INR is 2 - 3.  The next INR is due 09/12/2010.  Anticoagulation instructions were given to patient.  Results were reviewed/authorized by Cloyde Reams, RN, BSN.  She was notified by Cloyde Reams RN.         Prior Anticoagulation Instructions: INR 2.2 Continue taking 1 tablet every day, except take 1/2 tablet on Thursdays. Recheck in 4 weeks.  Current Anticoagulation Instructions: INR 1.0  Take 1.5 tablets today, then resume same dosage 1 tablet daily except 1/2 tablet on Thursdays.  Recheck in 1 week.

## 2010-09-14 ENCOUNTER — Telehealth (INDEPENDENT_AMBULATORY_CARE_PROVIDER_SITE_OTHER): Payer: Self-pay | Admitting: *Deleted

## 2010-09-18 NOTE — Medication Information (Signed)
Summary: rov/ewj  Anticoagulant Therapy  Managed by: Cloyde Reams, RN, BSN Referring MD: Willa Rough MD PCP: Illene Regulus, MD Supervising MD: Ladona Ridgel MD, Sharlot Gowda Indication 1: Atrial Fibrillation (ICD-427.31) Lab Used: [facility] Site: [facility] INR POC 1.7 INR RANGE 2 - 3  Dietary changes: no    Health status changes: no    Bleeding/hemorrhagic complications: no    Recent/future hospitalizations: no    Any changes in medication regimen? no    Recent/future dental: no  Any missed doses?: yes     Details: Resumed Coumadin 09/06/10.   Is patient compliant with meds? yes       Allergies: No Known Drug Allergies  Anticoagulation Management History:      The patient is taking warfarin and comes in today for a routine follow up visit.  Positive risk factors for bleeding include an age of 75 years or older and history of CVA/TIA.  The bleeding index is 'intermediate risk'.  Positive CHADS2 values include Age > 75 years old and Prior Stroke/CVA/TIA.  The start date was 03/20/1998.  Anticoagulation responsible provider: Ladona Ridgel MD, Sharlot Gowda.  INR POC: 1.7.  Exp: 07/2011.    Anticoagulation Management Assessment/Plan:      The patient's current anticoagulation dose is Coumadin 5 mg  tabs: Take as directed by coumadin clinic..  The target INR is 2 - 3.  The next INR is due 09/27/2010.  Anticoagulation instructions were given to patient.  Results were reviewed/authorized by Cloyde Reams, RN, BSN.  She was notified by Cloyde Reams RN.         Prior Anticoagulation Instructions: INR 1.0  Take 1.5 tablets today, then resume same dosage 1 tablet daily except 1/2 tablet on Thursdays.  Recheck in 1 week.    Current Anticoagulation Instructions: INR 1.7  Take an extra 1/2 tablet today, then resume same dosage 1 tablet daily except 1/2 tablet on Thursdays.  Recheck in 2 weeks.

## 2010-09-18 NOTE — Progress Notes (Signed)
  Phone Note Call from Patient Call back at Home Phone [phone]   Caller: Patient Summary of Call: wants to schedule with dietitian for general nutrition, [facility] Initial call taken by: Jamison Neighbor RD,CDE,  September 14, 2010 12:06 PM

## 2010-09-18 NOTE — Progress Notes (Signed)
  Phone Note Call from Patient   Summary of Call: Patient is requesting a call back about paperwork.  Initial call taken by: Lamar Sprinkles, CMA,  August 30, 2010 12:25 PM  Follow-up for Phone Call        Pt states her daughter is trying to get her some help with financial assistance with living in a retirement community. She states when she gets paperwork she will fax it for DR Brennley Curtice to fill out.  Follow-up by: Ami Bullins CMA,  August 31, 2010 9:33 AM  Additional Follow-up for Phone Call Additional follow up Details #1::        Pt called in today with c/o what she describes as scratches on her legs that will bleed and then stop. Pt states more prominent on right leg. She ask what she could do for the pain in her leg until her appt on thursday. Per md he suggested pt taken tylenol OTC and try warm compresses. Pt was informed Additional Follow-up by: Ami Bullins CMA,  September 11, 2010 11:35 AM    Additional Follow-up for Phone Call Additional follow up Details #2::    I also informed pt that I will wait on her paperwork from [place] for retirement living assistance. Follow-up by: Ami Bullins CMA,  September 11, 2010 11:36 AM

## 2010-09-18 NOTE — Op Note (Signed)
Summary: Epidural Steroid Injection / [facility]  Epidural Steroid Injection / [facility]   Imported By: Lennie Odor 09/14/2010 15:07:02  _____________________________________________________________________  External Attachment:    Type:   Image     Comment:   External Document

## 2010-09-20 ENCOUNTER — Other Ambulatory Visit (HOSPITAL_COMMUNITY): Payer: Self-pay | Admitting: Specialist

## 2010-09-20 DIAGNOSIS — IMO0002 Reserved for concepts with insufficient information to code with codable children: Secondary | ICD-10-CM

## 2010-09-20 LAB — CBC
MCHC: 33.6 g/dL (ref 30.0–36.0)
Platelets: 200 10*3/uL (ref 150–400)
RDW: 14.9 % (ref 11.5–15.5)

## 2010-09-20 LAB — DIFFERENTIAL
Basophils Absolute: 0 K/uL (ref 0.0–0.1)
Basophils Relative: 0 % (ref 0–1)
Eosinophils Absolute: 0 K/uL (ref 0.0–0.7)
Eosinophils Relative: 0 % (ref 0–5)
Lymphocytes Relative: 7 % — ABNORMAL LOW (ref 12–46)
Lymphs Abs: 0.7 K/uL (ref 0.7–4.0)
Monocytes Absolute: 0.8 K/uL (ref 0.1–1.0)
Monocytes Relative: 8 % (ref 3–12)
Neutro Abs: 8.9 K/uL — ABNORMAL HIGH (ref 1.7–7.7)
Neutrophils Relative %: 85 % — ABNORMAL HIGH (ref 43–77)

## 2010-09-20 LAB — COMPREHENSIVE METABOLIC PANEL WITH GFR
ALT: 28 U/L (ref 0–35)
AST: 32 U/L (ref 0–37)
Albumin: 3.8 g/dL (ref 3.5–5.2)
Alkaline Phosphatase: 46 U/L (ref 39–117)
BUN: 26 mg/dL — ABNORMAL HIGH (ref 6–23)
CO2: 30 meq/L (ref 19–32)
Calcium: 9.6 mg/dL (ref 8.4–10.5)
Chloride: 109 meq/L (ref 96–112)
Creatinine, Ser: 1.13 mg/dL (ref 0.4–1.2)
GFR calc non Af Amer: 46 mL/min — ABNORMAL LOW
Glucose, Bld: 106 mg/dL — ABNORMAL HIGH (ref 70–99)
Potassium: 4.2 meq/L (ref 3.5–5.1)
Sodium: 144 meq/L (ref 135–145)
Total Bilirubin: 1 mg/dL (ref 0.3–1.2)
Total Protein: 6.5 g/dL (ref 6.0–8.3)

## 2010-09-20 LAB — CK TOTAL AND CKMB (NOT AT ARMC)
CK, MB: 1.7 ng/mL (ref 0.3–4.0)
Relative Index: INVALID (ref 0.0–2.5)
Total CK: 42 U/L (ref 7–177)

## 2010-09-20 LAB — HEMOGLOBIN A1C
Hgb A1c MFr Bld: 5.4 %
Mean Plasma Glucose: 108 mg/dL

## 2010-09-20 LAB — URINE MICROSCOPIC-ADD ON

## 2010-09-20 LAB — URINALYSIS, ROUTINE W REFLEX MICROSCOPIC
Hgb urine dipstick: NEGATIVE
Nitrite: NEGATIVE
Specific Gravity, Urine: 1.023 (ref 1.005–1.030)
Urobilinogen, UA: 1 mg/dL (ref 0.0–1.0)

## 2010-09-20 LAB — PROTIME-INR
INR: 1.41 (ref 0.00–1.49)
Prothrombin Time: 13.1 seconds (ref 11.6–15.2)
Prothrombin Time: 17.5 s — ABNORMAL HIGH (ref 11.6–15.2)

## 2010-09-20 LAB — APTT: aPTT: 25 s (ref 24–37)

## 2010-09-20 LAB — LIPID PANEL
Cholesterol: 194 mg/dL (ref 0–200)
LDL Cholesterol: 112 mg/dL — ABNORMAL HIGH (ref 0–99)
Triglycerides: 96 mg/dL (ref <150)
VLDL: 19 mg/dL (ref 0–40)

## 2010-09-21 ENCOUNTER — Ambulatory Visit (HOSPITAL_COMMUNITY)
Admission: RE | Admit: 2010-09-21 | Discharge: 2010-09-21 | Disposition: A | Discharge status: Home / Self Care | Payer: Medicare Other | Source: Ambulatory Visit | Attending: Specialist | Admitting: Specialist

## 2010-09-21 DIAGNOSIS — M4120 Other idiopathic scoliosis, site unspecified: Secondary | ICD-10-CM | POA: Insufficient documentation

## 2010-09-21 DIAGNOSIS — M51379 Other intervertebral disc degeneration, lumbosacral region without mention of lumbar back pain or lower extremity pain: Secondary | ICD-10-CM | POA: Insufficient documentation

## 2010-09-21 DIAGNOSIS — M5137 Other intervertebral disc degeneration, lumbosacral region: Secondary | ICD-10-CM | POA: Insufficient documentation

## 2010-09-21 DIAGNOSIS — IMO0002 Reserved for concepts with insufficient information to code with codable children: Secondary | ICD-10-CM

## 2010-09-25 NOTE — Op Note (Signed)
Summary: Epidural Steroid Injection / [facility]  Epidural Steroid Injection / [facility]   Imported By: Lennie Odor 09/14/2010 15:09:50  _____________________________________________________________________  External Attachment:    Type:   Image     Comment:   External Document

## 2010-09-25 NOTE — Letter (Signed)
Summary: [facility]   Imported By: Lennie Odor 09/14/2010 15:08:43  _____________________________________________________________________  External Attachment:    Type:   Image     Comment:   External Document

## 2010-09-25 NOTE — Assessment & Plan Note (Signed)
Summary: right ankle area up toward knee feels warm and a bleeding wou...   Vital Signs:  Patient profile:   77 year old female Height:      61.5 inches Weight:      118 pounds BMI:     22.01 O2 Sat:      95 % on Room air Temp:     97.6 degrees F oral Pulse rate:   68 / minute BP sitting:   130 / 80  (left arm) Cuff size:   regular  Vitals Entered By: Bill Salinas CMA (September 13, 2010 4:02 PM)  O2 Flow:  Room air CC: pt here with c/o left leg discomfort and she c/o bleeding sores on her right leg/ab   Primary Care Provider:  Illene Regulus, MD  CC:  pt here with c/o left leg discomfort and she c/o bleeding sores on her right leg/ab.  History of Present Illness: Patient with multiple issues: 1. she is moving to [facility] and asks about a new doctor - recommended seeking a board certified internist 2. small lesions on her right leg that have bled 3. small nodule on the right forearm that is sore 4. Increase DOE - she has seen Dr. Myrtis Ser 08/28/10 but did not c/o SOB.  Current Medications (verified): 1)  Coumadin 5 Mg  Tabs (Warfarin Sodium) .... Take As Directed By Coumadin Clinic. 2)  Glucosamine Chondroitin Complx   Tabs (Glucosamine-Chondroit-Vit C-Mn) .... Take Once Daily 3)  B Complex   Tabs (B Complex Vitamins) .... Take Once Daily 4)  Selenium 50 Mcg  Tabs (Selenium) .... Take Once Daily 5)  Ambien 5 Mg  Tabs (Zolpidem Tartrate) .... Take 1/4 Tablet At Bedtime 6)  Coq10 .... Take Once Daily 7)  Calcium Carbonate 1250 Mg/10ml Susp (Calcium Carbonate) .... 2 Tablespoons Daily 8)  Carvedilol 6.25 Mg  Tabs (Carvedilol) .... Take One Tablet Two Times A Day 9)  Vitamin C Cr 1000 Mg Cr-Tabs (Ascorbic Acid) .Marland Kitchen.. 1 Tablet By Mouth Three Times A Day 10)  Furosemide 40 Mg Tabs (Furosemide) .Marland Kitchen.. 1 Tablet By Mouth Once Daily 11)  Vitamin D 1000 Unit Tabs (Cholecalciferol) .... 2 Tabs Daily 12)  Gabapentin 100 Mg Caps (Gabapentin) .... As Needed  Allergies (verified): No Known Drug  Allergies  Past History:  Past Medical History: Last updated: 08/28/2010 Hx of INTERNAL HEMORRHOIDS (ICD-455.0) Hx of DIVERTICULOSIS, COLON (ICD-562.10) COLONIC POLYPS (ICD-211.3) VERTIGO, HX OF (ICD-V12.49) CAROTID STENOSIS BILATERAL (ICD-433.10)...mild... Doppler... August, 2009  /  0-39% bilateral... Doppler.. August, 2011 SKIN CANCER, HX OF (ICD-V10.83) CORONARY ARTERY DISEASE MILD (ICD-414.00)...question coronary artery spasm in the past. MITRAL REGURGITATION, MILD (ICD-396.3) COUMADIN THERAPY (ICD-V58.61) ZENKER'S DIVERTICULUM (ICD-530.6) ALLERGIC RHINITIS (ICD-477.9) ATRIAL FIBRILLATION, CHRONIC (ICD-427.31) PLANTAR FASCIITIS, RIGHT (ICD-728.71) EF 55% .. echo.. march, 2007... improved from prior LV dysfunction  /   EF 60%... echo.. September, 2011 Tricuspid regurgitation mild to moderate... echo... march 2007 Volume overload... episode October, 2010 shortness of breath from not taking her meds for 3 days stabilized. History SCHAMBERG purpura (progressive pigmentary purpura) skin changes on legs from Coumadin History of leg discomfort but normal leg Dopplers multiple fractures from fall 2005 stabilized orthostatic hypotension...mild vertigo....mild hoarseness.Marland KitchenENT evaluation.. Dr.Shoemaker.. march, 2010... no masses found.. no cord dysfunction.... treated with PPI and other reflux measures and followup at [facility] as needed we're prior surgery for Zenker's diverticulum has been done MIXED HEARING LOSS BILATERAL (ICD-389.22) FIBROMYALGIA (ICD-729.1) CHRONIC FOLLICULAR CONJUNCTIVITIS (ICD-372.12) Eyelid surgery TIA   September, 2011 ... while patient  was off Coumadin briefly...head CT no acute abnormality... 2-D echo normal LV function... symptoms resolved completely... patient back on Coumadin Stress   related to taking care of her sister   February, 2012     Past Surgical History: Last updated: 09/17/2007 * EYELID SURGERY * SHOULDER SURGERY * ZENKER DIVERTICULAR REPAIR  X2 FOOT SURGERY, HX OF (ICD-V15.89) INTRAOCULAR LENS IMPLANT, LEFT EYE, HX OF (ICD-V43.1) CATARACT EXTRACTION, LEFT EYE, HX OF (ICD-V45.61) HEMORRHOIDECTOMY, HX OF (ICD-V45.89) INGUINAL HERNIORRHAPHY, RIGHT, HX OF (ICD-V45.89) TONSILLECTOMY, HX OF (ICD-V45.79)  Family History: Last updated: 05/05/2009 No FH of Colon Cancer: Non-contributrory  Social History: Last updated: 05/05/2009 married '42- '85 - widowed 1 son '51; 1 grandchild Lives alone- I-ADLs including driving.  End-of-Life : No DPR, No mechanical, no heroic measures,  i.e. artificial feeding, etc.  Patient is a former smoker.  Alcohol Use - yes Daily Caffeine Use  Review of Systems       The patient complains of dyspnea on exertion and difficulty walking.  The patient denies anorexia, fever, weight loss, weight gain, decreased hearing, chest pain, syncope, peripheral edema, hemoptysis, abdominal pain, severe indigestion/heartburn, muscle weakness, and depression.    Physical Exam  General:  alert, well-developed, well-nourished, and well-hydrated elderly white woman in no acute distress.   Head:  normocephalic and atraumatic.   Eyes:  C&S clear. PERRLA Neck:  supple.   Lungs:  normal respiratory effort, no intercostal retractions, no accessory muscle use, normal breath sounds, no crackles, and no wheezes.   Heart:  normal rate and regular rhythm.   Neurologic:  alert & oriented X3, cranial nerves II-XII intact, and gait normal.   Skin:  turgor normal and color normal.  3 small lesions o the left lower extremity that could have been istes of active bleeding. Cause of small lesions - unknown.  Cervical Nodes:  no anterior cervical adenopathy and no posterior cervical adenopathy.   Psych:  Oriented X3, memory intact for recent and remote, good eye contact, and not anxious appearing.     Impression & Recommendations:  Problem # 1:  DYSPNEA (ICD-786.05) Persistent problem. Last echo  March '07 with EF 55-60% with  mild hypokinesis basal inferior wall.  Plan - will defer to Dr. Myrtis Ser.  Problem # 2:  COUMADIN THERAPY (ICD-V58.61) Patient with very small lesions on the leg but on coumadin she could have dramatic amount of bleeding from small wounds.  Plan - for definitive diagnosis of lesions she should see her dermatolbogist.   Complete Medication List: 1)  Coumadin 5 Mg Tabs (Warfarin sodium) .... Take as directed by coumadin clinic. 2)  Glucosamine Chondroitin Complx Tabs (Glucosamine-chondroit-vit c-mn) .... Take once daily 3)  B Complex Tabs (B complex vitamins) .... Take once daily 4)  Selenium 50 Mcg Tabs (Selenium) .... Take once daily 5)  Ambien 5 Mg Tabs (Zolpidem tartrate) .... Take 1/4 tablet at bedtime 6)  Coq10  .... Take once daily 7)  Calcium Carbonate 1250 Mg/6ml Susp (Calcium carbonate) .... 2 tablespoons daily 8)  Carvedilol 6.25 Mg Tabs (Carvedilol) .... Take one tablet two times a day 9)  Vitamin C Cr 1000 Mg Cr-tabs (Ascorbic acid) .Marland Kitchen.. 1 tablet by mouth three times a day 10)  Furosemide 40 Mg Tabs (Furosemide) .Marland Kitchen.. 1 tablet by mouth once daily 11)  Vitamin D 1000 Unit Tabs (Cholecalciferol) .... 2 tabs daily 12)  Gabapentin 100 Mg Caps (Gabapentin) .... As needed   Orders Added: 1)  Est. Patient Level III [47829]

## 2010-09-26 ENCOUNTER — Encounter: Payer: Medicare Other | Admitting: *Deleted

## 2010-09-26 ENCOUNTER — Ambulatory Visit (INDEPENDENT_AMBULATORY_CARE_PROVIDER_SITE_OTHER): Payer: Medicare Other | Admitting: *Deleted

## 2010-09-26 DIAGNOSIS — G459 Transient cerebral ischemic attack, unspecified: Secondary | ICD-10-CM

## 2010-09-26 DIAGNOSIS — I4891 Unspecified atrial fibrillation: Secondary | ICD-10-CM

## 2010-09-26 LAB — CBC
HCT: 44.9 % (ref 36.0–46.0)
Hemoglobin: 14.7 g/dL (ref 12.0–15.0)
RBC: 4.66 MIL/uL (ref 3.87–5.11)
WBC: 6.2 10*3/uL (ref 4.0–10.5)

## 2010-09-26 LAB — BASIC METABOLIC PANEL
Calcium: 9.1 mg/dL (ref 8.4–10.5)
GFR calc Af Amer: >60 mL/min (ref >60)
GFR calc non Af Amer: 59 mL/min — ABNORMAL LOW (ref >60)
Potassium: 3.9 mEq/L (ref 3.5–5.1)
Sodium: 145 mEq/L (ref 135–145)

## 2010-09-26 LAB — DIFFERENTIAL
Basophils Absolute: 0.2 10*3/uL — ABNORMAL HIGH (ref 0.0–0.1)
Eosinophils Relative: 3 % (ref 0–5)
Lymphocytes Relative: 21 % (ref 12–46)
Lymphs Abs: 1.3 10*3/uL (ref 0.7–4.0)
Monocytes Absolute: 0.6 10*3/uL (ref 0.1–1.0)
Neutro Abs: 3.9 10*3/uL (ref 1.7–7.7)

## 2010-09-26 LAB — POCT INR: INR: 2.6

## 2010-09-26 NOTE — Patient Instructions (Signed)
INR 2.6 Continue 5mg s everyday except 2.5mg s on Thursdays. Recheck in 4 weeks.

## 2010-10-17 ENCOUNTER — Ambulatory Visit (INDEPENDENT_AMBULATORY_CARE_PROVIDER_SITE_OTHER): Payer: Medicare Other | Admitting: *Deleted

## 2010-10-17 DIAGNOSIS — G459 Transient cerebral ischemic attack, unspecified: Secondary | ICD-10-CM

## 2010-10-17 DIAGNOSIS — Z7901 Long term (current) use of anticoagulants: Secondary | ICD-10-CM

## 2010-10-17 DIAGNOSIS — I4891 Unspecified atrial fibrillation: Secondary | ICD-10-CM

## 2010-10-17 LAB — POCT INR: INR: 1.1

## 2010-10-24 ENCOUNTER — Encounter: Payer: Medicare Other | Admitting: *Deleted

## 2010-10-26 ENCOUNTER — Ambulatory Visit (INDEPENDENT_AMBULATORY_CARE_PROVIDER_SITE_OTHER): Payer: Medicare Other | Admitting: *Deleted

## 2010-10-26 ENCOUNTER — Telehealth: Payer: Self-pay | Admitting: Cardiology

## 2010-10-26 DIAGNOSIS — I4891 Unspecified atrial fibrillation: Secondary | ICD-10-CM

## 2010-10-26 DIAGNOSIS — G459 Transient cerebral ischemic attack, unspecified: Secondary | ICD-10-CM

## 2010-10-26 LAB — POCT INR: INR: 2.5

## 2010-10-26 NOTE — Patient Instructions (Signed)
Pt requesting a referral to [facility] for Coumadin Clinic.

## 2010-11-12 ENCOUNTER — Encounter: Payer: Self-pay | Admitting: Cardiology

## 2010-11-13 ENCOUNTER — Encounter: Payer: Self-pay | Admitting: Cardiology

## 2010-11-13 DIAGNOSIS — E8770 Fluid overload, unspecified: Secondary | ICD-10-CM | POA: Insufficient documentation

## 2010-11-13 DIAGNOSIS — I4891 Unspecified atrial fibrillation: Secondary | ICD-10-CM | POA: Insufficient documentation

## 2010-11-13 DIAGNOSIS — M79606 Pain in leg, unspecified: Secondary | ICD-10-CM | POA: Insufficient documentation

## 2010-11-13 DIAGNOSIS — I2510 Atherosclerotic heart disease of native coronary artery without angina pectoris: Secondary | ICD-10-CM | POA: Insufficient documentation

## 2010-11-13 DIAGNOSIS — Z7901 Long term (current) use of anticoagulants: Secondary | ICD-10-CM | POA: Insufficient documentation

## 2010-11-13 DIAGNOSIS — I340 Nonrheumatic mitral (valve) insufficiency: Secondary | ICD-10-CM | POA: Insufficient documentation

## 2010-11-13 DIAGNOSIS — L817 Pigmented purpuric dermatosis: Secondary | ICD-10-CM | POA: Insufficient documentation

## 2010-11-13 DIAGNOSIS — R490 Dysphonia: Secondary | ICD-10-CM | POA: Insufficient documentation

## 2010-11-13 DIAGNOSIS — R42 Dizziness and giddiness: Secondary | ICD-10-CM | POA: Insufficient documentation

## 2010-11-13 DIAGNOSIS — G459 Transient cerebral ischemic attack, unspecified: Secondary | ICD-10-CM | POA: Insufficient documentation

## 2010-11-13 DIAGNOSIS — F439 Reaction to severe stress, unspecified: Secondary | ICD-10-CM | POA: Insufficient documentation

## 2010-11-13 DIAGNOSIS — I071 Rheumatic tricuspid insufficiency: Secondary | ICD-10-CM | POA: Insufficient documentation

## 2010-11-13 DIAGNOSIS — K225 Diverticulum of esophagus, acquired: Secondary | ICD-10-CM | POA: Insufficient documentation

## 2010-11-13 DIAGNOSIS — R9430 Abnormal result of cardiovascular function study, unspecified: Secondary | ICD-10-CM | POA: Insufficient documentation

## 2010-11-13 DIAGNOSIS — I6529 Occlusion and stenosis of unspecified carotid artery: Secondary | ICD-10-CM | POA: Insufficient documentation

## 2010-11-13 DIAGNOSIS — I951 Orthostatic hypotension: Secondary | ICD-10-CM | POA: Insufficient documentation

## 2010-11-14 ENCOUNTER — Encounter: Payer: Self-pay | Admitting: Cardiology

## 2010-11-14 ENCOUNTER — Ambulatory Visit (INDEPENDENT_AMBULATORY_CARE_PROVIDER_SITE_OTHER): Payer: Medicare Other | Admitting: Cardiology

## 2010-11-14 ENCOUNTER — Ambulatory Visit (INDEPENDENT_AMBULATORY_CARE_PROVIDER_SITE_OTHER): Payer: Medicare Other | Admitting: *Deleted

## 2010-11-14 DIAGNOSIS — G459 Transient cerebral ischemic attack, unspecified: Secondary | ICD-10-CM

## 2010-11-14 DIAGNOSIS — I340 Nonrheumatic mitral (valve) insufficiency: Secondary | ICD-10-CM

## 2010-11-14 DIAGNOSIS — E8779 Other fluid overload: Secondary | ICD-10-CM

## 2010-11-14 DIAGNOSIS — E8770 Fluid overload, unspecified: Secondary | ICD-10-CM

## 2010-11-14 DIAGNOSIS — I4891 Unspecified atrial fibrillation: Secondary | ICD-10-CM

## 2010-11-14 DIAGNOSIS — I059 Rheumatic mitral valve disease, unspecified: Secondary | ICD-10-CM

## 2010-11-14 DIAGNOSIS — Z733 Stress, not elsewhere classified: Secondary | ICD-10-CM

## 2010-11-14 DIAGNOSIS — F439 Reaction to severe stress, unspecified: Secondary | ICD-10-CM

## 2010-11-14 LAB — POCT INR: INR: 4.5

## 2010-11-14 NOTE — Progress Notes (Signed)
HPI The patient is seen today for followup of atrial fibrillation and her other cardiac issues.  She is actually stable from the cardiac viewpoint.  She's been having some hip pain.  Is not having chest pain or shortness of breath.  She has had some increased ecchymoses.  Her INR is high today.  This explains the issue. No Known Allergies  Current Outpatient Prescriptions  Medication Sig Dispense Refill  . Ascorbic Acid (VITAMIN C CR) 1000 MG TBCR Take 1 tablet by mouth 3 (three) times daily.        Marland Kitchen b complex vitamins tablet Take 1 tablet by mouth daily.        . calcium carbonate, dosed in mg elemental calcium, 1250 MG/5ML 2 tablespoons daily       . carvedilol (COREG) 6.25 MG tablet Take 6.25 mg by mouth 2 (two) times daily with a meal.        . Cholecalciferol (VITAMIN D3) 1000 UNITS tablet 2 tabs daily       . co-enzyme Q-10 30 MG capsule Take by mouth daily. Take once daily       . furosemide (LASIX) 40 MG tablet Take 40 mg by mouth daily.        . Glucosamine-Chondroit-Vit C-Mn (GLUCOSAMINE CHONDROITIN COMPLX) TABS Take by mouth daily.        . Magnesium 400 MG CAPS Take 1 capsule by mouth.        . warfarin (COUMADIN) 5 MG tablet Take by mouth as directed.        . zolpidem (AMBIEN) 5 MG tablet Take 1/4 tablet at bedtime       . DISCONTD: gabapentin (NEURONTIN) 100 MG capsule As needed       . DISCONTD: selenium 50 MCG TABS Take 50 mcg by mouth daily.          History   Social History  . Marital Status: Widowed    Spouse Name: N/A    Number of Children: 1  . Years of Education: N/A   Occupational History  . Not on file.   Social History Main Topics  . Smoking status: Former Games developer  . Smokeless tobacco: Not on file  . Alcohol Use: Yes  . Drug Use: Not on file  . Sexually Active: Not on file   Other Topics Concern  . Not on file   Social History Narrative  . No narrative on file    Family History  Problem Relation Age of Onset  . Colon cancer Neg Hx     Past  Medical History  Diagnosis Date  . Internal hemorrhoids   . Diverticulitis of colon   . Hx of colonic polyps   . Vertigo     mild  . Carotid stenosis     bilateral; mild... Doppler... August, 2009  /  0-39% bilateral...  Doppler.. August, 2011  . Skin cancer   . CAD (coronary artery disease)     Question coronary artery spasm in the past  . Mitral regurgitation     mild  . Allergic rhinitis   . Atrial fibrillation   . Plantar fasciitis     right  . Tricuspid regurgitation     mild to  moderate  . Volume overload   . Schamberg's purpura     Skin changes on the legs from Coumadin  . Multiple fractures 2005    from fall, stabilized  . Orthostatic hypotension     mild  . Mixed hearing loss,  bilateral   . Fibromyalgia   . Chronic follicular conjunctivitis   . TIA (transient ischemic attack)     September, 2011, while off Coumadin briefly, head CT no abnormality, symptoms resolved, back on Coumadin  . Stress   . Hx of colonoscopy   . Mitral regurgitation     Mild  . Warfarin anticoagulation   . Ejection fraction     55%, echo, 2007, improved from prior assessment /     ejection fraction 60%, echo, September, 2011  . Tricuspid regurgitation     Mild/moderate,  . Volume overload     As shortness of breath and not taking her diuretics  . Leg pain     Normal Dopplers  . Hoarseness     ENT evaluation March, 2010, no cord dysfunction, treated PPI,  . Zenker's diverticulum     Prior surgery due  . Stress     From taking care of her sister February, 2012         Past Surgical History  Procedure Date  . Eyelid surgery   . Shoulder surgery   . Zenker diverticular repair     x2  . Foot surgery   . Intraocular lens insertion     left eye  . Hemorrhoid surgery   . Inguinal hernia repair     right  . Tonsillectomy     ROS  Patient denies fever, chills, headache, sweats, rash, change in vision, change in hearing, chest pain, cough, nausea vomiting, urinary symptoms.   All other systems are reviewed and are negative.  PHYSICAL EXAM Patient is really stable today.  She is oriented to person time and place.  Affect is normal.  There is no xanthelasma.  There is no carotid bruits.  Lungs are clear.  Respiratory effort is unlabored.  Cardiac exam reveals S1-S2.  There are no clicks or significant murmurs.  The abdomen is soft there is no peripheral edema. Filed Vitals:   11/14/10 1554  BP: 108/76  Pulse: 82  Height: 5\' 1"  (1.549 m)  Weight: 117 lb (53.071 kg)    EKG EKG is done today and reviewed by me.  She has atrial fibrillation with a controlled rate.  ASSESSMENT & PLAN

## 2010-11-14 NOTE — Patient Instructions (Signed)
Your physician wants you to follow-up in:  6 months. You will receive a reminder letter in the mail two months in advance. If you don't receive a letter, please call our office to schedule the follow-up appointment.   

## 2010-11-14 NOTE — Assessment & Plan Note (Signed)
Volume status is stable. No change in therapy. 

## 2010-11-14 NOTE — Assessment & Plan Note (Signed)
The patient now has moved to the town of [facility].  She is closer to her family.  We will be happy to send her records to the doctor's closer to her home.  She is not ready for [place] to do this yet.

## 2010-11-14 NOTE — Assessment & Plan Note (Signed)
Mitral regurgitation is stable.  No further workup

## 2010-11-15 ENCOUNTER — Encounter: Payer: Self-pay | Admitting: Cardiology

## 2010-11-15 NOTE — Progress Notes (Signed)
Pt now lives in the [facility] [place], she is looking into possibly changing MDs down the road, she will eventually become a pt of [facility] which is a [facility] primary care and also a pt of Dr Luiz Ochoa at [facility], she just wanted [place] to have this info on file for the future.  She will let us know when she transitions and we can fax records

## 2010-11-20 NOTE — Assessment & Plan Note (Signed)
[facility]                            CARDIOLOGY OFFICE NOTE   NAME:Diaz, Pamela GRYDER                     MRN:          [id number]  DATE:04/06/2008                            DOB:          1921/05/23    [name] is here for cardiology followup.  I believe her cardiac  status is stable.  She has had some ongoing shortness of breath, but  this has change not changed.  I will not proceed with any further  workup.  She continues to have some hoarseness.  She says that her voice  gets more and more hoarse as the day goes on.  She had seen ENT.  There  was no definite diagnosis.  I do not have any of the results.  It has  been mentioned that GI followup might be helpful.  I would suggest that  she follow through.   She is not having any chest pain.   PAST MEDICAL HISTORY:   ALLERGIES:  No known drug allergies.   MEDICATIONS:  1. Coumadin.  2. Glucosamine.  3. Ambien.  4. Fish oil.  5. Calcium.  6. Carvedilol 6.25 b.i.d.   OTHER MEDICAL PROBLEMS:  See the list below.   REVIEW OF SYSTEMS:  Other than some shortness of breath and the  hoarseness in her voice, her review of systems is negative.   PHYSICAL EXAMINATION:  VITAL SIGNS:  Weight is 120 pounds.  Blood  pressure is 124/70 with a pulse of 75.  GENERAL:  The patient is oriented to person, time and place.  Affect is  normal.  HEENT:  No xanthelasma.  She has normal extraocular motion.  NECK:  There are no carotid bruits.  There is no jugular venous  distention.  LUNGS:  Clear.  Respiratory effort is not labored.  CARDIAC:  An S1with an S2.  The rhythm is irregularly irregular.  ABDOMEN:  Soft.  EXTREMITIES:  There is no peripheral edema.   EKG reveals atrial fibrillation.   PROBLEMS:  1. Chronic atrial fibrillation.  I had checked her average heart rate      in the past and it was stable.  2. Coumadin therapy.  3. History in the past of a Zenker diverticulum.  4. History of  wide complex beats in the past.  5. Mild mitral regurgitation.  6. Mild coronary artery disease by history.  7. Question of coronary spasm in the past.  8. History of some left ventricular dysfunction in the past that      improved.  Her most recent echo revealed an ejection fraction of      55%.  9. History of some volume overload overtime.  However, this is      stabilized.  10.History of skin cancer, treated in the past.  11.History of eyelid surgery.  12.History of Schamberg purpura (progressive pigmentary purpura).      There is seen a skin changes in her legs.  13.History of bilateral mild carotid stenosis.  Her last Doppler was      in August 2009.  She has mild disease bilaterally.  Followup can  be      in 2 years.  14.History of leg discomfort in the past but Dopplers showed no      abnormality.  15.History of a fall on October 2005 with multiple fractures, but this      stabilized.  16.History of some orthostatic hypotension in the past, stable.  17.History of some vertigo overtime, stable.  18.Question in the past of fibromyalgia.  19.Hoarseness.  As she had told me there was no diagnosis per ENT.  I      have recommended that she see a GI for evaluation.   Her cardiac status is stable.  One more problem Coumadin therapy for her  atrial fib.  I do not have her on a cholesterol medication.  Her last  LDL was 105 and HDL 60.     Luis Abed, MD, [facility]  Electronically Signed    JDK/MedQ  DD: 04/06/2008  DT: 04/07/2008  Job #: [id number]

## 2010-11-20 NOTE — Assessment & Plan Note (Signed)
[facility]                            CARDIOLOGY OFFICE NOTE   NAME:[name], [name]                     MRN:          [id number]  DATE:11/27/2006                            DOB:          03-24-1921    [name] is seen for cardiology followup.  I saw her last on November 04, 2006.  At that time, we talked about adding a small amount of  carvedilol.  When she first started on 3.125 in the morning, she did not  feel well.  She started taking it at night and tolerated it and then  switched to taking it in the morning.  She says she thinks she is  feeling somewhat better, and she will now begin to take it twice a day.   PAST MEDICAL HISTORY:  See the complete list of my note of November 04, 2006.   ALLERGIES:  No known drug allergies.   MEDICATIONS:  Coumadin, vitamins, Lasix 40, and carvedilol 3.125 mg  daily, to be increased to b.i.d.   REVIEW OF SYSTEMS:  She is doing well with mild shortness of breath.   PHYSICAL EXAMINATION:  VITAL SIGNS:  Blood pressure 132/85, heart rate  at the time of my exam was 60.  GENERAL:  Patient is oriented to person, time, and place, and her affect  is normal.  HEENT:  She has no xanthelasma.  NECK:  No carotid bruits.  LUNGS:  Clear.  Respiratory effort is not labored.  CARDIAC:  An S1 and S2.  There is a soft systolic murmur.  ABDOMEN:  She has normal bowel sounds.  EXTREMITIES:  There is no peripheral edema.   Problems are listed in my note of November 04, 2006.  She is doing well.  She will take the carvedilol 3.125 b.i.d.  I will see her back in four  weeks.     Pamela Abed, MD, [facility]  Electronically Signed    JDK/MedQ  DD: 11/27/2006  DT: 11/27/2006  Job #: [id number]   cc:   Rosalyn Gess. Norins, MD  Titus Dubin. Alwyn Ren, MD,FACP,FCCP

## 2010-11-20 NOTE — Assessment & Plan Note (Signed)
[facility]                            CARDIOLOGY OFFICE NOTE   NAME:Tabar, [name]                     MRN:          [id number]  DATE:07/29/2007                            DOB:          [birth year]    [name] is here for cardiology followup.  Cardiac status is  stable.  She has had some shortness of breath.  This has not changed  substantially.  I am not convinced that it is cardiac at this time.  She  has some hoarseness in her throat.  She was seen by ENT and we will try  to obtain this data.  There is question that she has reflux.  She will  consider further GI workup.   She is not having any chest pain.  She is not currently having any signs  of fluid overload.   PAST MEDICAL HISTORY:   ALLERGIES:  NO KNOWN DRUG ALLERGIES.   MEDICATIONS:  1. Coumadin as directed.  2. Glucosamine.  3. Vitamin B complex.  4. Selenium.  5. Fish oil.  6. Lasix 40 except on Wednesday.  7. Carvedilol 6.25 b.i.d.  8. Ambien.  9. Boniva.   OTHER MEDICAL PROBLEMS:  See the extensive list on my note of November 04, 2006.   REVIEW OF SYSTEMS:  Other than the hoarseness in her throat and some  shortness of breath, her review of systems is negative.   PHYSICAL EXAM:  Blood pressure is 140/84 with a pulse of 80.  The  patient is oriented to person, time and place.  Affect is normal.  HEENT:  Reveals no xanthelasma.  She has normal extraocular motion.  There are no carotid bruits.  There is no jugular venous distention.  Lungs are clear.  Respiratory effort is not labored.  Cardiac exam reveals S1-S2.  The rhythm is irregularly irregular.  Her abdomen is soft.  She has no peripheral edema.   EKG reveals her atrial fibrillation.   Problems are listed extensively in my note of November 04, 2006.  #1.  Atrial fibrillation, under control.  #3.  History of Zenker's diverticulum  #9.  History of some volume overload over time that is stable.  She is  on  Lasix.  #20.  New problem.  She is having some hoarseness in her throat.  Her  ENT evaluation did not leave any definite diagnosis.  I do not have that  data.  However, she mentions that she was told that reflux may be  playing a problem.  She may follow through with a GI evaluation.  No  further cardiac workup is needed.     Luis Abed, MD, [facility]  Electronically Signed    JDK/MedQ  DD: 07/29/2007  DT: 07/29/2007  Job #: [id number]   cc:   Rosalyn Gess. Norins, MD  Courtney Paris, M.D.  Angelena Sole, M.D. [facility]  Titus Dubin. Alwyn Ren, MD,FACP,FCCP

## 2010-11-20 NOTE — Assessment & Plan Note (Signed)
[name]                            CARDIOLOGY OFFICE NOTE   NAME:[name], [name]                     MRN:          [id number]  DATE:01/08/2007                            DOB:          18-May-1921    REASON FOR VISIT:  The patient is seen for follow up.   HISTORY OF THE PRESENT ILLNESS:  Please see my note of Nov 27, 2006 and  November 04, 2006.  She is now taking carvedilol 3.125 twice a day and she  is actually feeling well.  As a matter of face she went out of town and  forgot her medicines for two or three day, and states she definitely  could sense that she was feeling better on the carvedilol.  We will  therefore see if we can titrate the dose and find an optimal level.  She  still has some exertional shortness of breath.   ALLERGIES:  No known drug allergies.   MEDICATIONS:  Coumadin, glucosamine, vitamins, fish oil, Lasix 40, and  carvedilol 3.125 twice a day to be increased.  She also takes some other  supplements.   PAST MEDICAL HISTORY:  Other medical problems; see the complete list on  the note of November 04, 2006.   REVIEW OF SYSTEMS:  The patient has some mild hoarseness; the etiology  is unclear. Otherwise the medical review of systems is negative.   PHYSICAL EXAMINATION:  VITAL SIGNS:  Weight 129 pounds.  Blood pressure  is 140/85 with a pulse of 75.  GENERAL APPEARANCE:  The patient is oriented to person, time and place.  Affect is normal.  HEENT:  The head, eyes, ears, nose and throat reveal no xanthelasma.  She has normal extraocular motion.  NECK:  There are no carotid bruits.  There is no jugular venous  distention.  HEART:  Cardiac exam reveals an S1 with an S2.  There are no clicks or  significant murmurs.  ABDOMEN:  The abdomen is soft.  EXTREMITIES:  The patient has no peripheral edema.   PROBLEM LIST:  Problems are listed extensively in the note of November 04, 2006.   It seems that carvedilol may be helping her.   We will slowly increase  her dose and see how she feels.     Luis Abed, MD, [facility]  Electronically Signed    JDK/MedQ  DD: 01/08/2007  DT: 01/08/2007  Job #: [id number]

## 2010-11-20 NOTE — Assessment & Plan Note (Signed)
[facility]                            CARDIOLOGY OFFICE NOTE   NAME:Detlefsen, MANESSA BULEY                     MRN:          [id number]  DATE:02/04/2007                            DOB:          January 26, 1921    Ms. Segers returns for follow up, and she is really doing well.  We have  pushed her Coreg as high as we can.  At 6.125 b.i.d., she felt poorly.  She is doing well at 3.125 in the morning and 6.25 in the evening.  She  is going about good activities.  She is not having chest pain, syncope  or presyncope.   PAST MEDICAL HISTORY:   ALLERGIES:  No known drug allergies.   MEDICATIONS:  1. Coumadin.  2. Glucosamine.  3. Vitamin B.  4. Selenium.  5. Magnesium.  6. Fish oil.  7. Lasix 40 except on Wednesdays.  8. Carvedilol 3.125 in the morning and 6.25 in the evening.   OTHER MEDICAL PROBLEMS:  See the extensive list on my note of November 04, 2006.   REVIEW OF SYSTEMS:  She actually is doing well.  She is distressed very  much by having to help care for her sister whose husband will not follow  appropriate care.  Her sister and brother-in-law are very stable  financially, and I strongly encouraged her to allow the sister and  brother-in-law and their children to take care of them and that she  watch out for her own health.  She will look into the possibility of  moving closer to her son over the next couple of years.   PHYSICAL EXAMINATION:  VITAL SIGNS:  Blood pressure is 140/80 with a  pulse of 72.  GENERAL:  The patient is oriented to person, time and place, and her  affect is normal.  LUNGS:  Clear.  Respiratory effort is not labored.  CARDIAC:  An S1 with an S2.  There are no clicks or significant murmurs.  She has no peripheral edema.   Ms. Tabbert is stable.  Her atrial fibrillation is under good control  with the carvedilol dosing, and there will be no further changes in her  medications at this time.  Her problems are listed  completely on the  note of November 04, 2006.     Luis Abed, MD, [facility]  Electronically Signed    JDK/MedQ  DD: 02/04/2007  DT: 02/05/2007  Job #: [id number]   cc:   Rosalyn Gess. Norins, MD

## 2010-11-23 ENCOUNTER — Encounter: Payer: Medicare Other | Admitting: *Deleted

## 2010-11-23 NOTE — Assessment & Plan Note (Signed)
[facility]                              CARDIOLOGY OFFICE NOTE   NAME:[name], [name]                     MRN:          [id number]  DATE:02/25/2006                            DOB:          1921-01-03    [name] is seen for cardiology followup.  She is being bothered by  increased shortness of breath.  She is not having chest pain.  She has had a  problem in the past with some volume overload.  She has been on low dose  Lasix.  I believe there may be a volume component to her current shortness  of breath.  However, it is exertional shortness of breath.  She does not  appear to have any PND or orthopnea.   We did a followup echo in March 2007.  She had an ejection fraction of 55%.  There was mild to moderate tricuspid regurgitation.  There was no obvious  etiology of her shortness of breath by echo.  Her eyelid surgery was  successful.  She has some mild continued redness, and she is receiving  medications for this.   PAST MEDICAL HISTORY:   ALLERGIES:  NO KNOWN DRUG ALLERGIES.   MEDICATIONS:  Glucosamine, vitamin D, Coumadin as directed, other vitamins,  Lasix 20 mg daily (to be increased to 40 mg daily), Forteo, Ambien, Coenzyme-  Q, Lidoderm patch.   OTHER MEDICAL PROBLEMS:  See the complete list below.   REVIEW OF SYSTEMS:  Her shortness of breath is the most significant symptom.  She does complain about the discoloration of the skin of her legs.  We have  thought that this was Schamberg's purpura and she brings me other  information today on an issue called a progressive pigmentary purpura.  I  believe she does have something like this.  It is not that it can be proven  what caused it and there is no obvious significant treatment for it.  She  wears the hose to cover her lower legs.  It does not itch her at this time.  Otherwise her review of systems is negative.   PHYSICAL EXAMINATION:  VITAL SIGNS:  Blood pressure is 102/80 with a  pulse  of 91.  MENTAL STATUS:  The patient is oriented to person, time, and place, and her  affect is normal.  LUNGS:  Reveal a few scattered rales.  Respiratory effort is not labored.  HEENT:  Reveals no xanthelasma.  She has normal extraocular motion.  There  is a soft carotid bruit.  There is no jugular venous distention.  CARDIAC:  Reveals an S1 with an S2 and a soft systolic murmur.  ABDOMEN:  Soft.  There are no masses or bruits.  EXTREMITIES:  She has no significant peripheral edema.  She has a brownish  tinge type of skin change that is marching up her legs and behind the back  of her leg.   EKG reveals atrial fib with a controlled rate.   PROBLEMS:  1. Chronic atrial fibrillation.  It is possible that her shortness of      breath is due  to inadequate rate control.  When I see her back next      time, we will consider adjusting her medications further.  2. Coumadin therapy.  3. History of Zenker diverticulum.  4. History of wide complex beats in the past.  5. Mild mitral regurgitation.  6. Mild coronary disease.  7. Question of coronary spasm in the past.  8. History of left ventricular dysfunction in the past which improved and      the most recent ejection fraction was in the 55% range.  9. History of volume overload in September 2005.  I believe that she is a      little wet today and her Lasix dose is being increased.  10.History of skin cancer followed at [facility].  11.Status post eyelid surgery.  12.History of Schamberg's purpura or progressive pigmentary purpura.  13.History of mild bilateral carotid stenoses in 2005, and she is having      followup carotid Dopplers today.  14.Question of discomfort in her legs.  She had Dopplers of her legs on      January 13, 2006, showing that there is no significant abnormality.  15.History of a fall in October 2005, with multiple fractures that      improved.  16.History at one point in the past of orthostatic hypotension.   17.History of some vertigo over time.   Overall, she is doing relatively well.  She is traveling to see a friend who  has failing health later this week.  It is possible that she will diurese  some with the change in the diuretic.  We may need to adjust meds for atrial  fib rate control and see if this helps her feel better.                               Luis Abed, MD, [facility]    JDK/MedQ  DD:  02/25/2006  DT:  02/25/2006  Job #:  [id number]

## 2010-11-23 NOTE — Op Note (Signed)
[name], [name]              ACCOUNT NO.:  [id number]   MEDICAL RECORD NO.:  [id number]          PATIENT TYPE:  OIB   LOCATION:  2899                         FACILITY:  [facility]   PHYSICIAN:  Kerrin Champagne, M.D.   DATE OF BIRTH:  07-Jan-1921   DATE OF PROCEDURE:  12/14/2004  DATE OF DISCHARGE:  12/14/2004                                 OPERATIVE REPORT   PREOPERATIVE DIAGNOSIS:  Left angulated humeral neck fracture.   POSTOPERATIVE DIAGNOSIS:  Left angulated humeral neck fracture.   PROCEDURE:  Closed manipulation, left angulated humeral neck fracture.   SURGEON:  Kerrin Champagne, M.D.   ANESTHESIA:  GOT.   ANESTHESIOLOGIST:  Dr. Sherald Hess BLOOD LOSS:  0 cc.   DRAINS:  None.   CLINICAL HISTORY:  An 77 year old female, who 10 days ago fell landing on  her left shoulder, sustaining a left humeral neck fracture.  Seen in the  office, where x-rays demonstrated impacted left humeral neck fracture. The  axillary lateral demonstrated nearly 90 degrees apex anterior angular  deformity of the fracture site; otherwise, impaction primarily of the medial  cortex of the humeral neck. She is brought to the operating room to attempt  to improve the apex anterior angular deformity.   DESCRIPTION OF PROCEDURE:  After adequate general anesthetic, the patient's  left shoulder brought off to the side of the table and the small C-arm  fluoroscope was brought in. The patient underwent manipulation of the  shoulder by anteriorly flexing the shoulder,  internally rotating fully, and  then adducting the shoulder in order to correct the angular deformity that  was primarily apex anterior. This appeared to correct the fracture quite  nicely; both AP and  external rotation views of the humeral head showed  abduction deformity, but no significant persistent angular deformity  anteriorly located. She was then replaced into a shoulder immobilizer and  ABD of left axillary region.  The left  hand brought to the right shoulder  and the strap was transversed at the waist, and brought over the right  breast area of right upper chest --  in order to maintain the left hand at  the right shoulder area. With this then, the patient was reactivated,  extubated, and returned to the recovery room in satisfactory condition. Also  instrument and sponge counts were correct.     JEN/MEDQ  D:  12/14/2004  T:  12/14/2004  Job:  [id number]

## 2010-11-23 NOTE — Assessment & Plan Note (Signed)
[facility]                                 ON-CALL NOTE   NAME:Mcwethy, KIANNAH GRUNOW                     MRN:          [id number]  DATE:10/05/2006                            DOB:          1920-07-23    ADDENDUM   Ms. Whitcomb re-paged me via the operator and stated that she had her  blood pressure reading now. It is 147/91 and her heart rate is 60. With  this, I again stated that she is doing fine and that I believe that  there is some anxiety related to her hand discomfort and also, she  states that she had a death of a friend last night that may have caused  her to get anxious and some palpitations. It appears to be quite stable  and we will continue to monitor her. She will follow up with Dr. Myrtis Ser as  needed, or as previously scheduled.     Lorain Childes, MD  Electronically Signed    CGF/MedQ  DD: 10/05/2006  DT: 10/05/2006  Job #: [id number]

## 2010-11-23 NOTE — Discharge Summary (Signed)
[name]                        ACCOUNT NO.:  [id number]   MEDICAL RECORD NO.:  [id number]                   PATIENT TYPE:  INP   LOCATION:  4731                                 FACILITY:  [facility]   PHYSICIAN:  [name], M.D.                  DATE OF BIRTH:  June 09, 1921   DATE OF ADMISSION:  DATE OF DISCHARGE:  03/04/2004                                 DISCHARGE SUMMARY   DISCHARGE DIAGNOSES:  1.  Volume overload/mild.  Congestive heart failure exacerbation.  2.  Nonischemic cardiomyopathy with a history of 15-20% ejection fraction.  3.  Permanent atrial fibrillation, on Coumadin anticoagulation.  4.  History of amiodarone-induced bradycardia.  5.  Mild mitral regurgitation.  6.  Mild coronary artery disease, question coronary spasm in the past.  7.  Zenker's diverticulum.   HISTORY OF PRESENT ILLNESS:  [name] is a pleasant 77 year old female  with a past history of nonischemic cardiomyopathy.  She had a previous  catheterization in 2000 which showed minimal luminal irregularities, and an  EF of 15-20% (which has varied over time).  She also has a history of atrial  fibrillation on Coumadin anticoagulation.  There is a history of bradycardia  on amiodarone therapy which has subsequently been discontinued.   Over the past three days, the patient has noticed increased dyspnea on  exertion and orthopnea.  There has been no chest pain or edema.  She has had  some palpitations, but no presyncope or syncope.  Because of this, she  presents to the emergency room.   In the ER, the patient is stable and in no acute distress.  Lungs are clear  to auscultation, and no edema on exam.  The EKG shows atrial fibrillation  with a rate of 90, and occasional PVC's.  There is poor R wave progression  and anterior infarct of undetermined age.  There are nonspecific ST changes.  Chest x-ray and labs are pending at the time of admission.   The patient will be admitted to  telemetry to monitor for potential  bradycardia.  We will check a BNP and TSH.  We will try to diurese her to  improve symptoms of dyspnea on exertion as well as orthopnea.  We will cycle  cardiac enzymes, although the likelihood of ischemia is low.  We will  continue Coumadin with a goal INR of 2 to 3.   PROCEDURES:  None.   COMPLICATIONS:  None.   CONSULTATIONS:  None.   HOSPITAL COURSE:  [name] was admitted to [facility] for the  aforementioned medical problems.  Admission laboratory studies as follows:  UA negative.  Cardiac enzymes negative x3.  TSH 3.420.  WBC 4.9, hemoglobin  16.2 and platelets 221,000.  INR 2.9.  Sodium 141, potassium 4.3, BUN 22,  creatinine 1.0, and glucose 84.  LFT's are within normal limits.  BNP 69.   Chest x-ray:  Cardiomegaly with vascular congestion.  No edema or air-space  opacity, and no effusion.   The patient remained cardiovascularly stable during her hospitalization.  She was diuresed gently and had improvement in her symptoms by March 04, 2004.  Weight was down approximately 4 pounds from admission with a  discharge weight of 121.  Exam was unremarkable with lungs clear and no  evidence of JVD or edema.   DISCHARGE LABS:  Sodium 143, potassium 4.8, chloride 108, CO2 30, BUN 37,  creatinine 1.3, with BNP of 64, and magnesium of 2.3.   The patient was felt stable for discharge to home, and will need followup  with Dr. Myrtis Ser.   DISCHARGE MEDICATIONS:  1.  Lasix 20 mg a day.  2.  Altace 5 mg twice a day.  3.  Potassium 20 mEq a day.  4.  Coumadin 5 mg a day.  5.  Multivitamin.   DISCHARGE ACTIVITY:  As tolerated.   DISCHARGE DIET:  No more than two liters of total fluid a day, and 2 gram  salt restriction.   Please note that Dr. Dorethea Clan examined the patient on March 03, 2004, and  apparently the patient seems to have limited insight into her heart failure.  She states she has been, pushing fluids because she thought that  would  help with her heart failure.   We have asked the patient to weigh herself daily, and to notify the office  of a greater than three pound weight gain in two days, or five pounds in one  week.   I will ask the office to have her follow up with Dr. Myrtis Ser in the next week  to two weeks.      [name], P.A.                   [name], M.D.    TCJ/MEDQ  D:  03/04/2004  T:  03/05/2004  Job:  [id number]   cc:   [name], M.D.

## 2010-11-23 NOTE — H&P (Signed)
[name], [name]                        ACCOUNT NO.:  [id number]   MEDICAL RECORD NO.:  [id number]                   PATIENT TYPE:  EMS   LOCATION:  MAJO                                 FACILITY:  [facility]   PHYSICIAN:  Olga Millers, M.D. [facility]            DATE OF BIRTH:  09/06/1920   DATE OF ADMISSION:  03/01/2004  DATE OF DISCHARGE:                                HISTORY & PHYSICAL   HISTORY OF PRESENT ILLNESS:  [name] is an extremely pleasant 77 year old  female with a past medical history of cardiomyopathy.  The patient has had a  previous cardiac catheterization in 2000.  At that time, she was found to  have mild irregularities in her left main.  The LAD had a proximal less than  20% stenosis.  There were mild irregularities in the circumflex.  The right  coronary artery had a 30-40% stenosis at the mid point.  Her ejection  fraction was in the 15-20% range.  She also has a history of permanent  atrial fibrillation.  She also has a history of bradycardia and had been on  Amiodarone previously but this was discontinued.  The patient states that  for the past three days, she has noticed increasing dyspnea on exertion as  well as orthopnea.  There is on PND or pedal edema and there has been no  chest pain.  She also has had palpitations but there has been no presyncope  or syncope.  Because of this, she presented to the emergency room.   CURRENT MEDICATIONS:  Altace 5 mg p.o. b.i.d., Lasix 20 mg p.o. daily,  potassium 10 mEq p.o. daily, and Coumadin 5 mg p.o. daily.  She is also on a  multi-vitamin.   ALLERGIES:  She has no known drug allergies.   SOCIAL HISTORY:  She denies any tobacco use.  She occasionally consumes  alcohol.   FAMILY HISTORY:  Positive for coronary artery disease in her father.   PAST MEDICAL HISTORY:  She denies any diabetes mellitus, hypertension, or  hyperlipidemia.  She does have a history of cardiomyopathy and permanent  atrial fibrillation as  described in the HPI.  She also has a history of  Zenker's diverticula.  She has reflux.  She has a history of appendectomy,  tonsillectomy, and right inguinal herniorrhaphy.   REVIEW OF SYMPTOMS:  She denies any headaches or fevers.  There is no  productive cough or hemoptysis.  There is no dysphagia at present, although  she has had it occasionally in the past secondary to her diverticulum.  There is no melena or hematochezia.  She had a questionable episode of  hematuria approximately two months ago.  There is orthopnea and PND but  there is no pedal edema.  There is no claudication noted.  The remaining  systems are negative.   PHYSICAL EXAMINATION:  VITAL SIGNS:  Pulse 90, blood pressure 170/60.  GENERAL:  Well developed, well  nourished in no acute distress.  SKIN:  Warm and dry.  There is no peripheral clubbing.  HEENT:  Unremarkable with normal eyelids.  She is wearing dark sunglasses  for an eye disorder.  She may need to have surgery on this by her report.  NECK:  Supple with a normal upstroke bilaterally and I cannot appreciate  bruits.  There is no jugular venous distention and I cannot appreciate  thyromegaly.  CHEST:  Clear to auscultation with normal expansion.  CARDIOVASCULAR:  Irregular rhythm.  I cannot appreciate murmurs, gallops,  and rubs.  ABDOMEN:  Nontender, positive bowel sounds, no hepatosplenomegaly, no masses  appreciated.  There is no abdominal bruit.  She has 2+ femoral pulses  bilaterally and no bruits.  EXTREMITIES:  No edema, I can palpate no cords.  She does have varicosities.  She has chronic skin changes on her lower extremities.  She has 2+ dorsalis  pedes pulses bilaterally.  NEUROLOGICAL:  Grossly intact.   LABORATORY DATA:  Her electrocardiogram shows atrial fibrillation at a rate  of 90.  There are occasional PVCs or aberrantly conductive beats.  There is  poor R wave progression and a prior anterior infarction cannot be excluded.  There are  nonspecific ST changes.  The remaining laboratories and chest x-  ray are pending.   DIAGNOSIS:  1. Congestive heart failure.  2. History of nonischemic cardiomyopathy.  3. Permanent atrial fibrillation.  4. Past history of bradycardia.  5. History of gastroesophageal reflux disease and Zenker's diverticulum.   PLAN:  We will admit Ms. Pund to telemetry and follow her for any evidence  of decreased heart rate (she does state that her heart rate has been in the  40s at times).  I will check a BNP as well as a TSH.  We will increase her  diuretic dose to improve her symptoms of dyspnea on exertion as well as  orthopnea.  We will cycle enzymes although I doubt ischemia.  We will  continue her Coumadin with a goal line of 2 to 3.  She had an episode of  hematuria approximately two months ago by her report.  We will check a  urinalysis.  Hopefully, she can be discharged in 24 to 48 hours if her heart  failure symptoms improve.                                                Olga Millers, M.D. [facility]    BC/MEDQ  D:  03/01/2004  T:  03/01/2004  Job:  [id number]

## 2010-11-23 NOTE — Assessment & Plan Note (Signed)
[facility]                            CARDIOLOGY OFFICE NOTE   NAME:[name], [name]                     MRN:          [id number]  DATE:11/04/2006                            DOB:          [birth year]    [name] is actually doing relatively well.  She has discomfort that she now  describes as fibromyalgia.  Some medicines have been recommended, but  she is hesitant to take them.  She does have some exertional shortness  of breath that is mild.  I have questioned before whether or not it  could be related to her atrial fibrillation rate.  She has not had any  major chest pain that sounds like angina.  There has been no syncope or  presyncope.   ALLERGIES:  NO KNOWN DRUG ALLERGIES.   MEDICATIONS:  1. Coumadin, as directed.  2. Vitamins.  3. Ambien p.r.n.  4. Lidoderm patch.  5. Lasix 40.  6. Flax seed.  7. Calcium.   OTHER MEDICAL PROBLEMS:  See the extensive list below.   REVIEW OF SYSTEMS:  As mentioned in the HPI, she has exertional  shortness of breath, and she has pain from her fibromyalgia.  The  patient saw Dr. Aldean Ast in followup.  On October 21, 2006, there was an  abdominal CT pre and post-contrast and a pelvic CT with contrast.  The  results revealed no major abnormalities.  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  Otherwise, her review of systems is negative.   PHYSICAL EXAMINATION:  VITAL SIGNS:  Today, blood pressure is 150/92.  Most recently, her pressure has not been a problem, and we will check it  again when I see her back in a few weeks.  Heart rate is 73.  GENERAL:  The patient is oriented to person, time, and place.  Affect is  normal.  She looks, as always, remarkably young for her true age.  HEENT:  No xanthelasma.  She has normal extraocular motion.  NECK:  There are no carotid  bruits.  There is no jugular venous  distension.  LUNGS:  Clear.  Respiratory effort is not labored.  CARDIAC:  S1 with an S2.  There are no clicks or significant murmurs.  ABDOMEN:  Soft.  There are no masses or bruits.  EXTREMITIES:  She has no significant peripheral edema.   EKG today reveals her atrial fibrillation.  The rate is 78 beats per  minute.   LABORATORY DATA:  On October 23, 2006, a blood work was done and drawn  away from our office.  Hemoglobin was 15.5.  Potassium 4.2, with BUN 17  and creatinine 1.1.  Her LDL was 105, HDL 57, triglycerides 116.  LFTs  were normal.   PROBLEMS:  1. Chronic atrial fibrillation.  Note that recently she wore a Holter      monitor, and I observed that her heart rate had been in the range      of 85 during the day.  There  were no marked swings.  I have decided      as of today, however, to add a beta-blocker to see if this helps      control her heart rate when she walks because of her exertional      shortness of breath.  2. Coumadin therapy.  3. History of Zenker diverticulum.  4. History of wide-complex beats in the past.  5. Mild mitral regurgitation.  6. Mild coronary disease.  7. Question of coronary spasm in the past.  8. History of left ventricular dysfunction in the past that improved      with her most recent echocardiogram in the range of 55%.  9. History of some volume overload over time.  I believe her volume      status is stable.  We will keep her on the current dose of Lasix.  10.History of skin cancer followed at [facility].  11.Status post eyelid surgery.  12.History of Schamberg's purpura (progressive pigmentary purpura).  13.Mild bilateral carotid stenoses.  Her most recent Doppler was done      in August of 2007 showing bilateral 0-39% stenoses, and this is      quite stable.  14.Question of discomfort in her legs, and her Dopplers showed no      abnormality in the past.  15.Episode of a fall in October of 2005 with  multiple fractures.      Because of this, we are very careful to keep on with her Coumadin      dosing.  16.Some orthostatic hypotension in the past.  17.Some vertigo over time.  18.Some fibromyalgia is now described by the patient.  19.Blood pressure.  Today her blood pressure is higher than usual.  We      will see what it is when I see her back on the small dose of      carvedilol.   I have decided to add a very small amount of carvedilol, and then I will  see her back.  No other changes are to be made at this time.     Luis Abed, MD, [facility]  Electronically Signed    JDK/MedQ  DD: 11/04/2006  DT: 11/04/2006  Job #: [id number]   cc:   Rosalyn Gess. Norins, MD  Courtney Paris, M.D.  Angelena Sole, M.D. [facility]

## 2010-11-23 NOTE — Assessment & Plan Note (Signed)
[facility]                                 ON-CALL NOTE   NAME:[name], Pamela SCAHILL                     MRN:          [id number]  DATE:10/05/2006                            DOB:          08/06/1920    TELEPHONE NUMBER:  [phone]   PRIMARY CARE PHYSICIAN:  Dr. Myrtis Ser.   CHIEF COMPLAINT:  She called complaining of right arm and wrist  discomfort.   I called the patient back. She had called earlier in the evening,  however, the number provided through the answering service was incorrect  and disconnected. So, she is calling us back this morning at 5am to  discuss her arm discomfort. The patient reports that she had arm pain  beginning in her right wrist extending to her fingers and upper forearm  beginning around 7:30 this evening. It became quite severe. She states  that she has known carpal tunnel syndrome and has been doing more  activity with pushing some boxes earlier today. This likely exacerbated  her discomfort. With the arm pain, she became anxious and noticed that  her heart rate felt like it was pounding. She denied any chest pain or  shortness of breath. Of note, she is on chronic Coumadin therapy for  known atrial fibrillation. She did not check her blood pressure or pulse  when she felt her heart pounding and now states that it is much better,  but she wanted to call because of her arm discomfort. She denied any  left sided chest or radiation to the arm. She reports that she is able  to use her right hand when it was intensely hurting. She did have  decreased mobility with it, however, now that it is improved, she is  able to flex and extend her hand. I discussed with [name] that I  felt that her pain was likely an exacerbation of her known carpal tunnel  syndrome related to her activity this evening. With the pain, she  probably became anxious and may have had some tachycardia. She said she  did not check her heart rate and I cannot  clearly define what was going  on. I did have her check her heart rate with me this early morning and  it was 67 beats-per-minute and irregular, which is her known chronic  atrial fibrillation. She attempted to check her blood pressure with her  blood pressure cuff, however, it is not functioning properly currently.  Overall, I feel that Ms. Tall is very stable. She says that she has  hydrocodone at home for chronic pain issues and she would like to take  this pill for her arm pain. I stated that this should be fine, since she  has been on this medication chronically. She usually takes 1/2 pill a  day and will take a full dose, a full pill, today. I have asked her to  call us back if she has any further discomfort or if she notes that her  heart rate goes up and have asked her to check her heart rate  periodically at home, in addition  to her blood pressure if her cuffs  function restores.     Lorain Childes, MD  Electronically Signed    CGF/MedQ  DD: 10/05/2006  DT: 10/05/2006  Job #: [id number]

## 2010-11-23 NOTE — Discharge Summary (Signed)
   NAMESHERRYLL, SKOCZYLAS                        ACCOUNT NO.:  [id number]   MEDICAL RECORD NO.:  [id number]                   PATIENT TYPE:  INP   LOCATION:  3707                                 FACILITY:  [facility]   PHYSICIAN:  Veneda Melter, M.D.                   DATE OF BIRTH:  06-29-1921   DATE OF ADMISSION:  01/24/2003  DATE OF DISCHARGE:  01/26/2003                           DISCHARGE SUMMARY - REFERRING   ADDENDUM:  Please send copies of this to Dr. Debby Bud, her primary care giver,  and Dr. Willa Rough at [facility] heart care.     Maple Mirza, P.A.                    Veneda Melter, M.D.    GM/MEDQ  D:  01/26/2003  T:  01/26/2003  Job:  [id number]   cc:   Rosalyn Gess. Norins, M.D. [facility]   Willa Rough, M.D.    cc:   Rosalyn Gess. Norins, M.D. [facility]   Willa Rough, M.D.

## 2010-11-23 NOTE — Assessment & Plan Note (Signed)
[facility]                              CARDIOLOGY OFFICE NOTE   NAME:[name]                     MRN:          [id number]  DATE:04/10/2006                            DOB:          [birth year]    HISTORY:  [name] is seen today for followup.  I had increased her Lasix  from 20 to 40.  She feels that this has had some effect.  Her shortness of  breath is somewhat improved.  However, she still has it.  The next step will  be to have her wear a 24-hour monitor to see what her heart rate is during  the day and to see if we need to adjust any other medicines for heart rate  control.  She and I talked about fluid and salt intake once again.  She is  not having any chest pain.  She has not had any syncope or presyncope.  Overall, she is feeling relatively well.   PAST MEDICAL HISTORY:   ALLERGIES:  NO KNOWN DRUG ALLERGIES.   MEDICATION:  See the prior listing in the chart.  She is on Lasix 40.   OTHER MEDICAL PROBLEMS:  See the complete list in the note of February 25, 2006 with problems number 1-17.   REVIEW OF SYSTEMS:  She is doing well.  Of course, she is always concerned  about the discoloration in her legs.  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  There is no significant treatment for this.  Otherwise, her review of  systems is negative.   PHYSICAL EXAMINATION:  GENERAL:  The patient is extremely stable and well  kempt for her age.  She is oriented to person, time and place, and her  affect is normal.  VITAL SIGNS:  Blood pressure is 140/76 with a pulse of 88 and her weight is  131 pounds.  HEENT:  Reveals no xanthelasma.  She has normal extraocular motion.  There  are no carotid bruits.  There is no jugular venous distention.  LUNGS:  Clear.  Respiratory effort is not labored.  CARDIAC:  Exam reveals a soft systolic murmur.  Her rhythm is irregularly   irregular.  ABDOMEN:  Soft.  She has no peripheral edema.  She does have the skin  changes of her Schamberg's purpura.  She has no peripheral edema.  She has  2+ distal pulses.   LABORATORIES:  No labs are done today.   PROBLEMS:  Problems are listed number 1-17 in my note of February 25, 2006.  All of her problems are stable other than her shortness of breath.  She will  wear a Holter monitor for 24 hours and she will remain on Lasix 40 once a  day.  Also, she and I talked about the fact that her calcium dosage has been  increased.  She is also on fish oil and flaxseed as requested by her  ophthalmologist.  I am quite comfortable with this as these are good for her  from the cardiovascular viewpoint.  ______________________________  Luis Abed, MD, [facility]     JDK/MedQ  DD:  04/10/2006  DT:  04/11/2006  Job #:  [id number]

## 2010-11-23 NOTE — Assessment & Plan Note (Signed)
[facility]                              CARDIOLOGY OFFICE NOTE   NAME:Machi, DAVYN ELSASSER                     MRN:          [id number]  DATE:05/22/2006                            DOB:          03-03-1921    Cephas Darby returns today for followup.  See my last note of April 10, 2006.  I did have her wear a Holter monitor.  When at sleep, her heart rate  was 70.  During the daytime, it averaged 85, but there were marked swings.  I have considered adding medication to slow her heart rate, but I have  decided not to do that as of today.   Ms. Kuna returns today, and she is stable.  She is not having any chest  pain. She does have some exertional shortness of breath, but it is  intermittent.  She has no PND or orthopnea.   PAST MEDICAL HISTORY:   ALLERGIES:  No known drug allergies.   MEDICATIONS:  1. Coumadin.  2. She takes multiple different vitamins.  3. Fish oil.  4. Lasix 40.  5. Calcium.   OTHER MEDICAL PROBLEMS:  See the complete list on the note of February 25, 2006.   REVIEW OF SYSTEMS:  Review of Systems today is negative other than the HPI.   PHYSICAL EXAMINATION:  GENERAL: The patient is well-developed, well-  nourished, and extremely active and healthy 77 year old female.  VITAL SIGNS: Blood pressure 122/86, pulse 70.  NEUROLOGIC: The patient is oriented to person, time, and place.  Her affect  is normal.  HEENT:  The patient has no xanthelasma.  She has normal extraocular motion.  NECK:  There are no carotid bruits.  There is no jugular venous distention.  LUNGS:  Clear.  Respiratory effort is not labored.  CARDIAC: S1 with an S2.  She has a soft systolic murmur.  ABDOMEN:  Soft.  EXTREMITIES:  There is trace pedal edema.   The problems are listed completely on my note of February 25, 2006.  #1.  Chronic atrial fibrillation.  I will continue to keep in mind the  possibility of adding a medication for heart rate control,  but I am not  convinced it is indicated at this time.  #9.  Some volume overload.  This is now stable.   The patient is stable, and no change in her medications.  I will see her  back in 6 months.     Luis Abed, MD, [facility]  Electronically Signed    JDK/MedQ  DD: 05/22/2006  DT: 05/22/2006  Job #: [id number]

## 2010-11-23 NOTE — H&P (Signed)
NAME:  Pamela, Diaz NO.:  [id number]   MEDICAL RECORD NO.:  [id number]                   PATIENT TYPE:  EMS   LOCATION:  MAJO                                 FACILITY:  [facility]   PHYSICIAN:  Carleene Cooper, M.D.                 DATE OF BIRTH:  06-23-1921   DATE OF ADMISSION:  01/24/2003  DATE OF DISCHARGE:                                HISTORY & PHYSICAL   CARDIOLOGIST:  Willa Rough, M.D.   PRIMARY CARE GIVER:  Rosalyn Gess. Norins, M.D.   PRESENTING CIRCUMSTANCE:  I have been feeling yucky.   HISTORY OF PRESENT ILLNESS:  [name] is an 77 year old female with a  history of atrial fibrillation who presented to Dr. Debby Bud office with most  recent history of progressive fatigue and dyspnea. She says that she cannot  walk to the mailbox and back without getting out of breath, and I would have  you remember that I was jogging as recently as 1991 when I had my myocardial  infarction. At Dr. Izora Ribas office today, an electrocardiogram showed a  bigeminal rhythm with supraventricular complexes consistent with atrial  fibrillation/flutter with a heart rate of 44, blood pressure preserved. She  was transferred to the [facility] emergency room for further evaluation.  Repeat electrocardiogram shows slow atrial fibrillation/flutter with ectopic  ventricular beats in bigeminy. Ms. Batchelder has had previous cardiac workup.  She had a left heart catheterization in the year 2000. This study showed  that the left main had mild irregularities, the left anterior descending had  a mild proximal stenosis less than 20%, the left circumflex system had mild  irregularities, the right coronary artery was dominant with 30 and 40%  tandem stenosis at the mid point. At the time of the catheterization, the  patient was in atrial fibrillation, rapid ventricular response, and the left  ventricular ejection fraction was estimated at 15 to 20%. She had a followup  echocardiogram  in December 2003. The left ventricular ejection fraction was  grossly normal. She had mild mitral regurgitation and mild mitral prolapse  and mild aortic regurgitation.   PAST MEDICAL HISTORY:  1. Known history of atrial fibrillation, controlled ventricular rate, on     chronic Coumadin therapy.  2. Hiatal hernia, known Zenker's diverticula.  3. History of skin cancer followed by the [facility].  4. Mitral regurgitation.  5. Mild coronary artery disease.  6. Status post appendectomy.  7. Status post right femoral herniorrhaphy.   ALLERGIES:  No known drug allergies.   MEDICATIONS:  1. Altace 5 mg p.o. b.i.d.  2. Coreg 6.25 mg p.o. b.i.d.  3. Norvasc 5 mg p.o. q.d.  4. Coumadin 5 mg daily.  5. Zinc tablet daily.  6. Folic acid daily.  7. Vitamin C 1,500 mg daily.  8. Vitamin E 400 International Units daily.  9. Multivitamin B complex daily.  10.  Glucosamine chondroitin daily, 1,500 mg.  11.      QT-10 daily.  12.      Bilberry daily.  13.      Selenium tablet daily.  14.      Fish oil capsules daily.   SOCIAL HISTORY:  The patient lives in [place]. She has been a widow for  the past 19 years. 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 She has not  smoked since college. She takes alcohol beverages daily, at least five mixed  drinks a week per patient. She also has extensive herbal medication use.   FAMILY HISTORY:  Mother died of old age at 51. Father died age 35 of old  age. She has two brothers, both of whom died accidental deaths. One sister  with osteoporosis. As far as she knows, no history of heart disease or  diabetes or dyslipidemia.   REVIEW OF SYSTEMS:  The patient does not have any fevers, chills, sweats, or  weight change recently. She denies any recent history of vertigo, syncope,  presyncope, vision or hearing loss. She does have lesions on both lower  extremities which are brawny, distinct macular lesions which cover both legs   on the medial aspects from ankle to mid thigh. She says these started after  Coumadin therapy. She is also having increasing shortness of breath and  dyspnea on exertion. She does complain of some lower extremity edema. She  does not have paroxysmal nocturnal dyspnea. She is not having any  claudication symptoms. No chronic cough or wheezing. She does have some  symptoms of GERD. She has some mild abdominal epigastric pain. She does  complain of recent bowel habit changes; she is less regular lately. The  patient denies any prior history of diabetes, cerebral vascular accident, GI  bleed, transient ischemic attacks, syncope or presyncope, DVT, pulmonary  embolism. No thyroid disease. Lipid status unknown. She does not partake of  tobacco and has no strong family history of coronary artery disease.   PHYSICAL EXAMINATION:  GENERAL:  This is an alert and oriented female in no  acute distress. She has just been complaining of progressive dyspnea and  fatigue.  VITAL SIGNS:  Temperature  96.8, pulse 54 and regularly irregularly,  respiratory rate 20, blood pressure 141/61, oxygen saturation 97% on room  air.  HEENT:  Eyes:  Pupils are equal, round, and reactive to light. Extraocular  movements are intact. The sclerae are without erythema or jaundice. Mucous  membranes are moist. She has good dentition. She does not wear dentures.  NECK:  Supple. No jugular venous distention. No carotid bruits auscultated.  No cervical lymphadenopathy.  HEART:  Irregularly irregular. She has a soft ejection murmur heard best at  the right intercostal border.  LUNGS:  With mild crackles at both basis, otherwise clear.  SKIN:  There is a macular discoloration in the medial aspects of both legs  from ankles to the thighs. This is brawny in nature and possibly represents  hemosiderin leakage from point hemorrhages secondary to Coumadin therapy. ABDOMEN:  Soft, nondistended, bowel sounds are present. There is no   hepatosplenomegaly. There is no rebound guarding on palpation.  EXTREMITIES:  Show no evidence of cyanosis or clubbing, and there is no  peripheral edema to speak of.  NEUROLOGICAL:  She is alert and oriented x3. Cranial nerves II-XII are  grossly intact. She has a good grip strength in both upper extremities. She  moves both upper and lower extremities appropriately. Has  normal sensation  throughout.   LABORATORY DATA:  Chest x-ray shows cardiomegaly with good definition of the  costophrenic silhouette. Electrocardiogram shows slow atrial  fibrillation/flutter with bigeminy, left axis deviation, heart rate of 44.  Laboratories have been obtained, CBC, BMET; these are pending. She will have  fasting lipid profile in the morning, and PT and INR are pending.    IMPRESSION:  1. Atrial fibrillation/flutter, slow rate with ventricular bigeminy.  2. Decreased fatigue, decreased exercise tolerance, increased dyspnea for     this admission.  3. Hiatal hernia.  4. Zenker's diverticulum.  5. Mild coronary artery disease, mild mitral regurgitation.  6. History of cardiomyopathy in the setting of rapid atrial fibrillation.  7. Long term history of ethanol use.   PLAN:  1. Adenosine IV challenge which was done in the emergency room and showed     some possibility of atrial flutter waves.  2. She will be admitted to [facility]. Her Coreg will be held. She will be     continued on Coumadin. She will start p.o. amiodarone 200 mg t.i.d. She     will also continue ethanol use. She will be discharged in one to two days     with repeat DC cardioversion in one to two weeks after amiodarone load.     Maple Mirza, P.A.                    Carleene Cooper, M.D.    GM/MEDQ  D:  01/24/2003  T:  01/24/2003  Job:  [id number]

## 2010-11-23 NOTE — Discharge Summary (Signed)
[name], [name]                        ACCOUNT NO.:  [id number]   MEDICAL RECORD NO.:  [id number]                   PATIENT TYPE:  INP   LOCATION:  3707                                 FACILITY:  [facility]   PHYSICIAN:  Veneda Melter, M.D.                   DATE OF BIRTH:  Nov 21, 1920   DATE OF ADMISSION:  01/24/2003  DATE OF DISCHARGE:  01/26/2003                           DISCHARGE SUMMARY - REFERRING   DISCHARGE DIAGNOSES:  1. Atrial fibrillation with premature ventricular contractions/bradycardic     rhythm which is symptomatic..fatigue/dyspnea.  2. Element of congestive heart failure this hospitalization started on     Lasix.   SECONDARY DIAGNOSES:  1. Known atrial fibrillation on chronic Coumadin therapy.  2. Hiatal hernia/Zenker's diverticulum.  3. History of skin cancer followed at the [facility].  4. Mitral regurgitation.  5. Status post appendectomy.  6. Status post right femoral herniorrhaphy.   DISCHARGE DISPOSITION:  [name] has been hospitalized for the  past three days.  She was admitted with a very slow bradycardic irregular  rhythm, heart rate in the 30s.  At that time, she was feeling progressively  fatigued and short of breath with no exercise tolerance.  She presented to  her primary care giver, Dr. Debby Bud, who, after obtaining an  electrocardiogram admitted her through [facility]  Emergency Room.  The electrocardiogram at the [facility] Emergency Room also  showed atrial fibrillation with a bigeminal rhythm, an irregular narrow  complex followed by a wide complex monomorphic PVC with very slow rate.  She  was admitted, her Coreg was discontinued and she was started on amiodarone  oral load.  She was subsequently seen in the hospital by Dr. Myrtis Ser, her  cardiologist.  He recommended discontinuing amiodarone for now, continuing  with her Coumadin therapy.  Upon auscultation, he determined that she had  rales and  a chest x-ray showed incipient element of pulmonary edema.  She  was then started on a low dose of Lasix with some correction in her  pulmonary status.  Her anticoagulation levels were therapeutic on admission  and have remained therapeutic this hospitalization.  She will go home on  January 26, 2003, feeling better, on the following medications:  1. Altace 5 mg p.o. b.i.d.  2. Coumadin 5 mg tablets, taking 1/2 tab 2.5 mg on Wednesday and Thursday,     and then presenting to [facility] Friday, July 23 for PT/INR to gauge     anticoagulation levels.  3. Lasix 20 mg daily.  4. Potassium chloride 10 mEq daily.  5. Ambien 5 mg at bedtime as needed for insomnia.  6. Resume folic acid.  7. Resume zinc tablet.  8. Vitamin C tablet.  9. Vitamin E tablet.  10.      B-complex tablet.  11.      Glucosamine Chondroitin.  12.  QT-10 tab.  13.      Bilberry tab.  14.      Selenium tablet.  15.      Fish oil capsules.   Once again, she is not to take any more Coreg and she is to discontinue  Norvasc.   DISCHARGE ACTIVITIES:  Walk daily to keep up her strength.   DISCHARGE DIET:  Low sodium, low cholesterol diet.   FOLLOW-UP:  She has a follow-up visit with [facility] Tuesday, February 01, 2003.  At that time, at 10:00, she will have an ultrasound of the lower  extremities.  She is having trouble with a brawny macular rash which covers  the medial aspects of both legs from ankle to mid thigh.  She will also see  Dr. Myrtis Ser at 11:45 on February 01, 2003.  Once again, she is to present to  [facility] on Friday, January 28, 2003 at 11:00 in the morning to see  [facility] to follow her Coumadin levels.   BRIEF HISTORY:  [name] is an 77 year old female with a history  of atrial fibrillation.  She presented to Dr. Debby Bud' office with a recent  history of progressive fatigue and dyspnea.  She says she cannot walk to the  mailbox and back without getting out of breath and, I would  have you  remember that I was jogging as recently as 1991 when I had my myocardial  infarction.  At Dr. Debby Bud' office today, electrocardiogram showed a  bigeminal rhythm with supraventricular complexes consistent with atrial  fibrillation/flutter and a heart rate of 44.  Blood pressure was preserved.  She was transferred to [facility] Emergency Room for further evaluation.  Repeat electrocardiogram showed slow atrial fibrillation/flutter with  ectopic beats and bigeminy.  Ms. Totten has had a previous cardiac workup.  She had left heart catheterization in the year 2000.  The study showed that  the left main had mild irregularities, the left anterior descending had a  mild proximal stenosis less than 20%, the left circumflex had mild  irregularities, the right coronary artery was dominant with a 30 and 40%  tandem stenosis at the mid point.  At the time of the catheterization, the  patient was in atrial fibrillation with rapid ventricular response and the  left ventricular ejection fraction was estimated at 15 to 20%.  She has had  a follow-up echocardiogram in December 2003.  At that time, the left  ventricular ejection fraction was grossly normal.  The study showed mild  mitral regurgitation, mild mitral prolapse and mild aortic regurgitation.   HOSPITAL COURSE:  As described in the discharge disposition.  Ms. Verdine Diaz presented to [facility] with symptomatic atrial fibrillation  which was slow.  Her Coreg was discontinued as was her Norvasc.  She was  started on amiodarone but this has been subsequently discontinued.  Her  Coumadin has been continued and she remains therapeutic and as a result of  these therapies, the patient does feel a lot better.  Her exercise tolerance  has increased.  She is ready to go home on January 26, 2003 with her  medications and follow-up as dictated.    Maple Mirza, P.A.                    Veneda Melter, M.D.    GM/MEDQ  D:   01/26/2003  T:  01/26/2003  Job:  [id number]

## 2010-11-29 ENCOUNTER — Ambulatory Visit: Payer: Self-pay | Admitting: Cardiology

## 2010-12-07 ENCOUNTER — Telehealth: Payer: Self-pay | Admitting: *Deleted

## 2010-12-07 NOTE — Telephone Encounter (Signed)
Pt called to let me know that she is sch to see new cardiologist Dr Luiz Ochoa at [facility] on 7/25, will fax records to [phone]

## 2010-12-23 ENCOUNTER — Telehealth: Payer: Self-pay | Admitting: Physician Assistant

## 2010-12-23 NOTE — Telephone Encounter (Signed)
Pt was away from home and ran out of Coumadin. Friend bringing it, so now needs no help.

## 2011-02-04 ENCOUNTER — Telehealth: Payer: Self-pay | Admitting: *Deleted

## 2011-02-04 NOTE — Telephone Encounter (Signed)
Pt called on Fri and stated that she had seen Dr Robin Searing but she did not her records, advised they were faxed back in June, she asked that they be refaxed to [phone] adn copy mailed to her all done

## 2011-08-12 IMAGING — CT CT HEAD W/O CM
1 of 2 series · 13 of 30 positions shown, 17 images · non-contrast
Comparison: Head CT 10/10/2009

CLINICAL DATA: Right-sided numbness in hand and face.

CT HEAD WITHOUT CONTRAST
TECHNIQUE: Contiguous axial images were obtained from the base of
the skull through the vertex without contrast.

[Series 2: brain · axial · 0.47mm/px · z∈[+128,+260]mm · 13 of 32 slices shown, 17 images]
[im 3/32  brain]
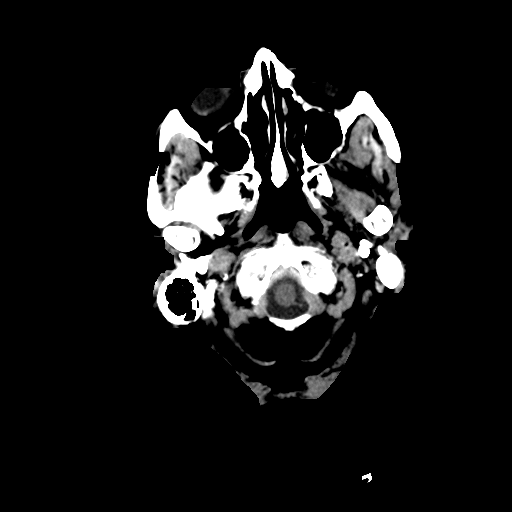
[im 3/32  bone]
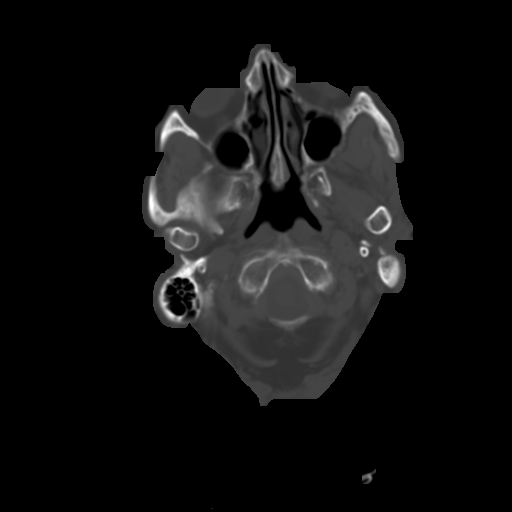
[im 5/32  brain]
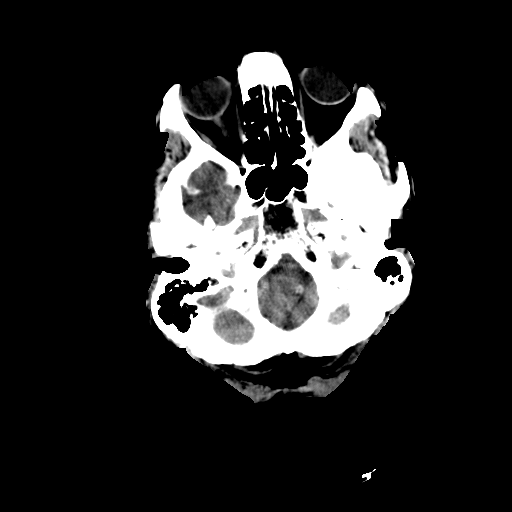
[im 7/32  brain]
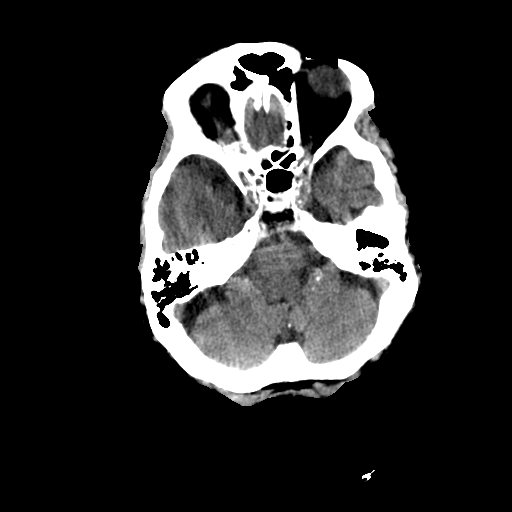
[im 9/32  brain]
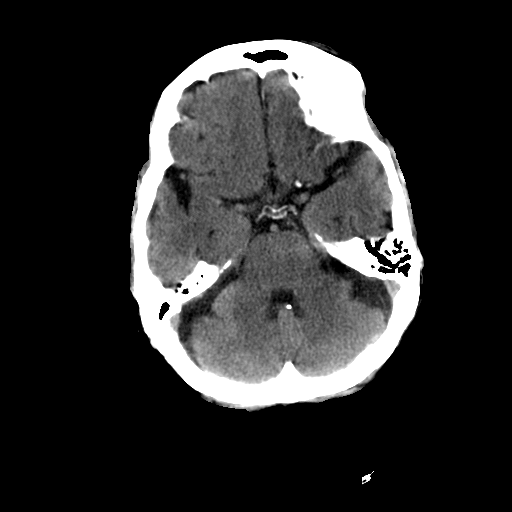
[im 12/32  brain]
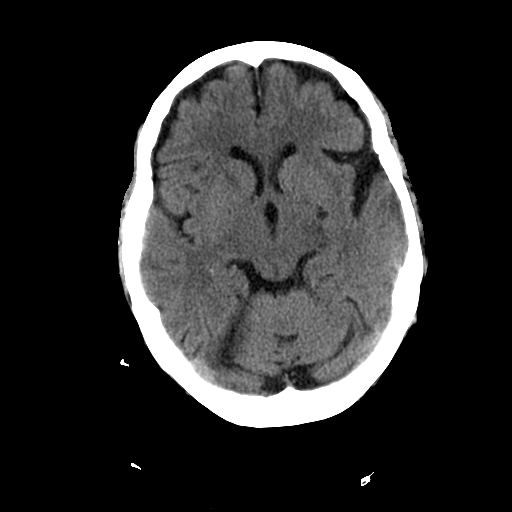
[im 12/32  bone]
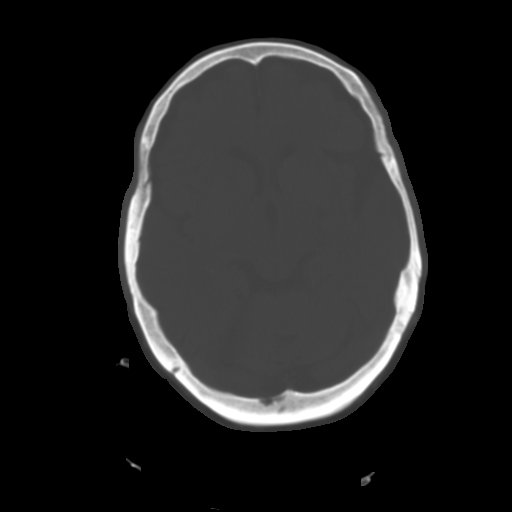
[im 14/32  brain]
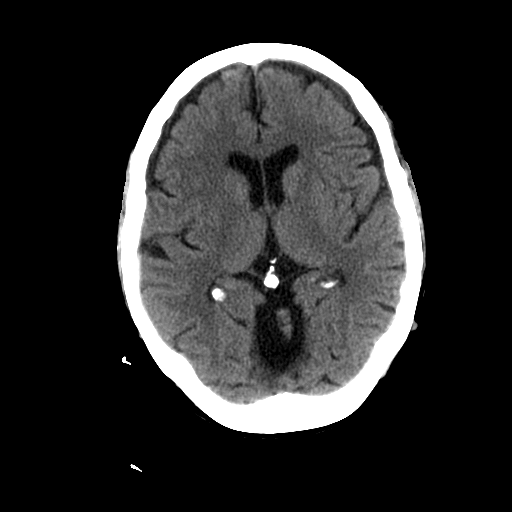
[im 16/32  brain]
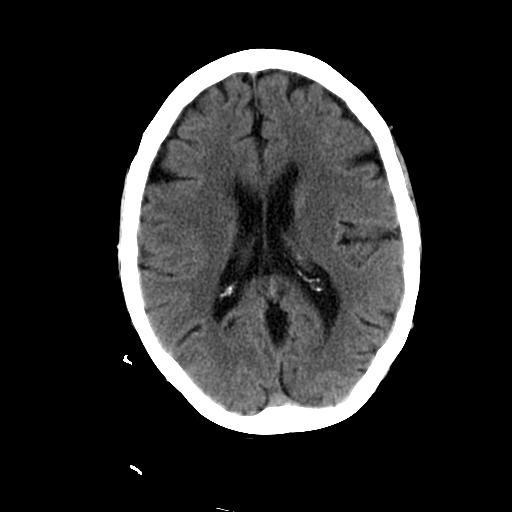
[im 18/32  brain]
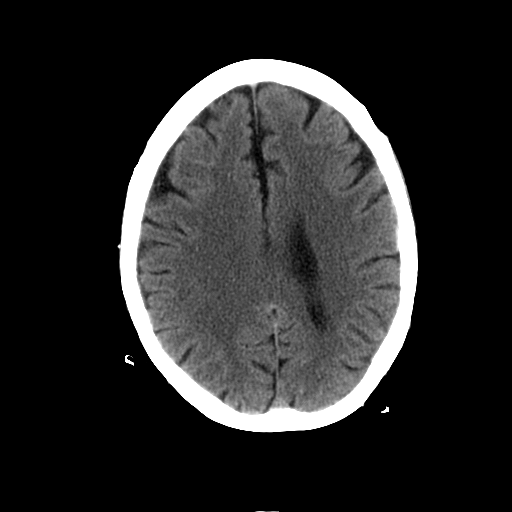
[im 20/32  brain]
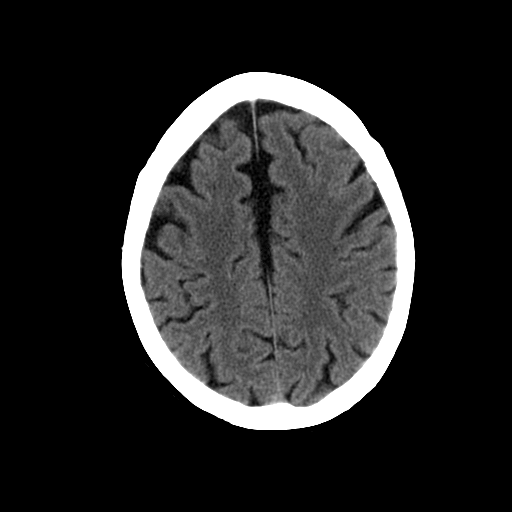
[im 20/32  bone]
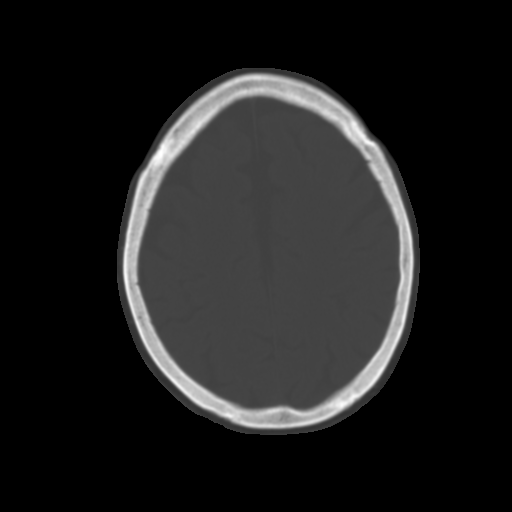
[im 23/32  brain]
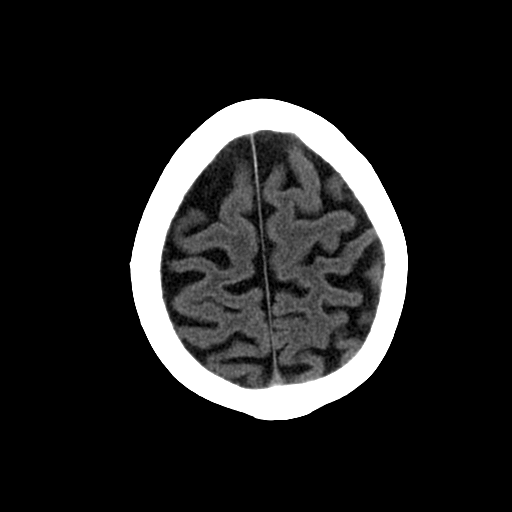
[im 25/32  brain]
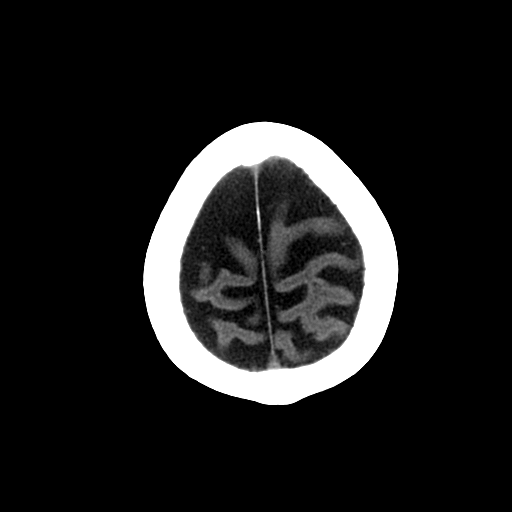
[im 27/32  brain]
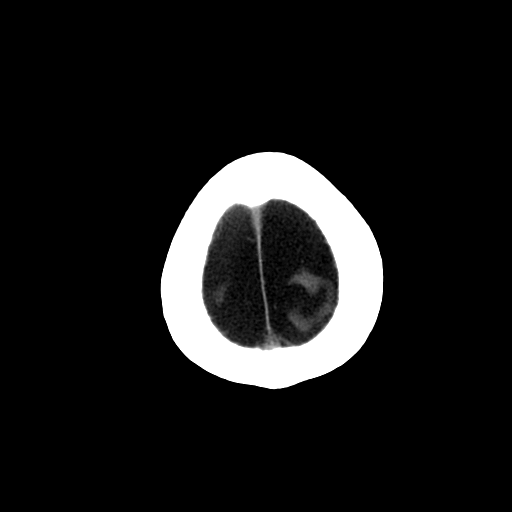
[im 29/32  brain]
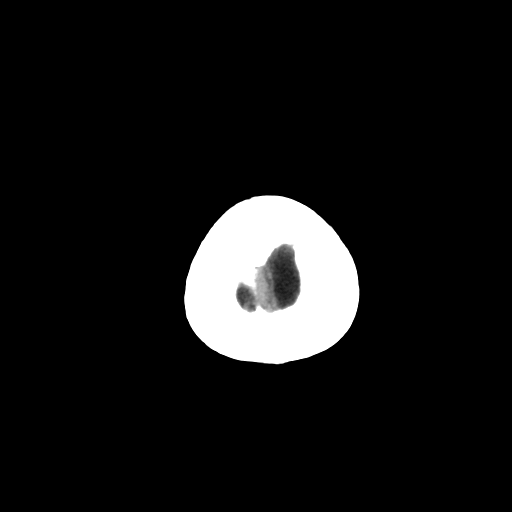
[im 29/32  bone]
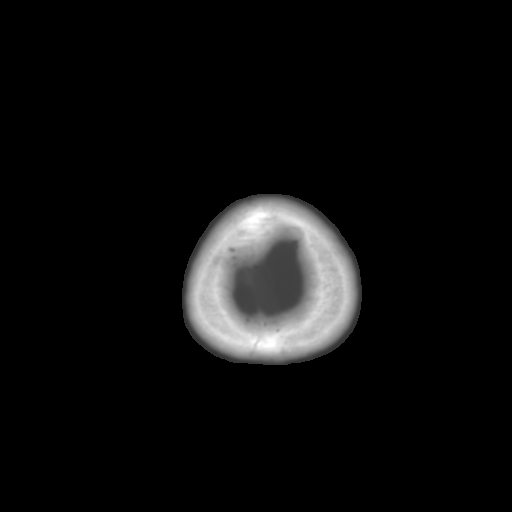

[13 of 30 positions shown; findings below may reference images not displayed]

FINDINGS: There is stable age related cerebral atrophy and mild
ventriculomegaly.  No significant white matter changes.  No extra-
axial fluid collections.  There are remote lacunar type basal
ganglia infarcts but no CT findings for acute hemispheric
infarction or intracranial hemorrhage.  The brainstem and
cerebellum grossly normal and stable.  No mass lesions are seen.

The bony calvarium is intact.  The visualized paranasal sinuses and
mastoid air cells are clear.
IMPRESSION: 1.  Stable age related cerebral atrophy and mild ventriculomegaly.
2.  Remote lacunar type basal ganglia infarcts.
3.  No acute intracranial findings.

## 2012-12-03 ENCOUNTER — Other Ambulatory Visit: Payer: Self-pay | Admitting: Dermatology

## 2014-04-22 NOTE — Telephone Encounter (Signed)
Nothing was typed/tmj 

## 2014-12-21 ENCOUNTER — Ambulatory Visit: Payer: Medicare Other | Admitting: Podiatry

## 2015-06-04 ENCOUNTER — Emergency Department (HOSPITAL_BASED_OUTPATIENT_CLINIC_OR_DEPARTMENT_OTHER): Payer: Medicare Other

## 2015-06-04 ENCOUNTER — Emergency Department (HOSPITAL_BASED_OUTPATIENT_CLINIC_OR_DEPARTMENT_OTHER)
Admission: EM | Admit: 2015-06-04 | Discharge: 2015-06-04 | Disposition: A | Discharge status: Home / Self Care | Payer: Medicare Other | Attending: Emergency Medicine | Admitting: Emergency Medicine

## 2015-06-04 ENCOUNTER — Encounter (HOSPITAL_BASED_OUTPATIENT_CLINIC_OR_DEPARTMENT_OTHER): Payer: Self-pay | Admitting: *Deleted

## 2015-06-04 DIAGNOSIS — Z86010 Personal history of colonic polyps: Secondary | ICD-10-CM | POA: Insufficient documentation

## 2015-06-04 DIAGNOSIS — Z8739 Personal history of other diseases of the musculoskeletal system and connective tissue: Secondary | ICD-10-CM | POA: Insufficient documentation

## 2015-06-04 DIAGNOSIS — I10 Essential (primary) hypertension: Secondary | ICD-10-CM | POA: Diagnosis not present

## 2015-06-04 DIAGNOSIS — Z8639 Personal history of other endocrine, nutritional and metabolic disease: Secondary | ICD-10-CM | POA: Insufficient documentation

## 2015-06-04 DIAGNOSIS — Z8669 Personal history of other diseases of the nervous system and sense organs: Secondary | ICD-10-CM | POA: Insufficient documentation

## 2015-06-04 DIAGNOSIS — Z8781 Personal history of (healed) traumatic fracture: Secondary | ICD-10-CM | POA: Insufficient documentation

## 2015-06-04 DIAGNOSIS — J441 Chronic obstructive pulmonary disease with (acute) exacerbation: Secondary | ICD-10-CM

## 2015-06-04 DIAGNOSIS — Z792 Long term (current) use of antibiotics: Secondary | ICD-10-CM | POA: Diagnosis not present

## 2015-06-04 DIAGNOSIS — Z7901 Long term (current) use of anticoagulants: Secondary | ICD-10-CM | POA: Diagnosis not present

## 2015-06-04 DIAGNOSIS — Z79899 Other long term (current) drug therapy: Secondary | ICD-10-CM | POA: Insufficient documentation

## 2015-06-04 DIAGNOSIS — Z8673 Personal history of transient ischemic attack (TIA), and cerebral infarction without residual deficits: Secondary | ICD-10-CM | POA: Insufficient documentation

## 2015-06-04 DIAGNOSIS — Z8719 Personal history of other diseases of the digestive system: Secondary | ICD-10-CM | POA: Diagnosis not present

## 2015-06-04 DIAGNOSIS — Z85828 Personal history of other malignant neoplasm of skin: Secondary | ICD-10-CM | POA: Diagnosis not present

## 2015-06-04 DIAGNOSIS — Z872 Personal history of diseases of the skin and subcutaneous tissue: Secondary | ICD-10-CM | POA: Diagnosis not present

## 2015-06-04 DIAGNOSIS — R0602 Shortness of breath: Secondary | ICD-10-CM | POA: Diagnosis present

## 2015-06-04 DIAGNOSIS — Z87448 Personal history of other diseases of urinary system: Secondary | ICD-10-CM | POA: Diagnosis not present

## 2015-06-04 DIAGNOSIS — I2510 Atherosclerotic heart disease of native coronary artery without angina pectoris: Secondary | ICD-10-CM | POA: Insufficient documentation

## 2015-06-04 DIAGNOSIS — Z87891 Personal history of nicotine dependence: Secondary | ICD-10-CM | POA: Diagnosis not present

## 2015-06-04 HISTORY — DX: Chronic obstructive pulmonary disease, unspecified: J44.9

## 2015-06-04 HISTORY — DX: Other specified disorders of veins: I87.8

## 2015-06-04 HISTORY — DX: Pulmonary hypertension, unspecified: I27.20

## 2015-06-04 HISTORY — DX: Disorder of kidney and ureter, unspecified: N28.9

## 2015-06-04 HISTORY — DX: Unspecified osteoarthritis, unspecified site: M19.90

## 2015-06-04 LAB — PROTIME-INR
INR: 2.05 — ABNORMAL HIGH (ref 0.00–1.49)
Prothrombin Time: 23 seconds — ABNORMAL HIGH (ref 11.6–15.2)

## 2015-06-04 LAB — CBC
HCT: 47.1 % — ABNORMAL HIGH (ref 36.0–46.0)
HEMOGLOBIN: 15.1 g/dL — AB (ref 12.0–15.0)
MCH: 30.9 pg (ref 26.0–34.0)
MCHC: 32.1 g/dL (ref 30.0–36.0)
MCV: 96.3 fL (ref 78.0–100.0)
Platelets: 234 10*3/uL (ref 150–400)
RBC: 4.89 MIL/uL (ref 3.87–5.11)
RDW: 14.9 % (ref 11.5–15.5)
WBC: 7.3 10*3/uL (ref 4.0–10.5)

## 2015-06-04 LAB — BASIC METABOLIC PANEL
ANION GAP: 6 (ref 5–15)
BUN: 27 mg/dL — ABNORMAL HIGH (ref 6–20)
CALCIUM: 9.3 mg/dL (ref 8.9–10.3)
CO2: 29 mmol/L (ref 22–32)
CREATININE: 0.96 mg/dL (ref 0.44–1.00)
Chloride: 108 mmol/L (ref 101–111)
GFR calc Af Amer: 58 mL/min — ABNORMAL LOW (ref >60)
GFR calc non Af Amer: 50 mL/min — ABNORMAL LOW (ref >60)
GLUCOSE: 116 mg/dL — AB (ref 65–99)
Potassium: 4.4 mmol/L (ref 3.5–5.1)
Sodium: 143 mmol/L (ref 135–145)

## 2015-06-04 LAB — BRAIN NATRIURETIC PEPTIDE: B NATRIURETIC PEPTIDE 5: 202.9 pg/mL — AB (ref 0.0–100.0)

## 2015-06-04 LAB — TROPONIN I: Troponin I: <0.03 ng/mL (ref <0.031)

## 2015-06-04 MED ORDER — ALBUTEROL SULFATE (2.5 MG/3ML) 0.083% IN NEBU
2.5000 mg | INHALATION_SOLUTION | Freq: Once | RESPIRATORY_TRACT | Status: AC
  Administered 2015-06-04: 2.5 mg via RESPIRATORY_TRACT
  Filled 2015-06-04: qty 3

## 2015-06-04 NOTE — ED Notes (Signed)
Pt reports hx of Copd and Sob; States this morning around 0400 she was very SOB. Has been eval at Urgent Care earlier today but has come here for further evaluation- Pt speaking full sentences. Family states she has not been taking her symbicort

## 2015-06-04 NOTE — ED Provider Notes (Signed)
CSN: [id number]     Arrival date & time 06/04/15  1710 History  By signing my name below, I, Erling Conte, attest that this documentation has been prepared under the direction and in the presence of Malvin Johns, MD. Electronically Signed: Erling Conte, ED Scribe. 06/04/2015. 8:30 PM.    Chief Complaint  Patient presents with  . Shortness of Breath   The history is provided by the patient and a relative (nephew). No language interpreter was used.    HPI Comments: [name] is a 81 y.o. female with a h/o COPD and a-fib on coumadin who presents to the Emergency Department complaining of intermittent, moderate, SOB onset this morning around 0400. Pt's nephew endorses associated intermittent, cough productive of clear and foamy phlegm. She reports she went to an UC earlier today but was sent here for further evaluation. She used her rescue ProAir inhaler around 0600, 12 hours ago, with no significant relief. She states she has not been compliant with her Symbicort medication and has not been complaint for over 1 week. Pt notes her COPD is typically exacerbated only with ambulation but this morning she was having it at rest. She notes she is unsure if this is typical of her prior COPD exacerbations. Pt's nephew notes she regularly takes Coumadin and Lasix. She denies any difficulty speaking in full sentences. Pt is a former smoker. She notes she wears compression stockings at home. She denies any fevers, nausea, vomiting, chest pain, leg swelling or other associated symptoms.  Past Medical History  Diagnosis Date  . Internal hemorrhoids   . Diverticulitis of colon   . Hx of colonic polyps   . Vertigo     mild  . Carotid stenosis     bilateral; mild... Doppler... August, 2009  /  0-39% bilateral...  Doppler.. August, 2011  . Skin cancer   . CAD (coronary artery disease)     Question coronary artery spasm in the past  . Mitral regurgitation     mild  . Allergic rhinitis   . Atrial  fibrillation ([place])   . Plantar fasciitis     right  . Tricuspid regurgitation     mild to  moderate  . Volume overload   . Schamberg's purpura     Skin changes on the legs from Coumadin  . Multiple fractures 2005    from fall, stabilized  . Orthostatic hypotension     mild  . Mixed hearing loss, bilateral   . Fibromyalgia   . Chronic follicular conjunctivitis   . TIA (transient ischemic attack)     September, 2011, while off Coumadin briefly, head CT no abnormality, symptoms resolved, back on Coumadin  . Stress   . Hx of colonoscopy   . Mitral regurgitation     Mild  . Warfarin anticoagulation   . Ejection fraction     55%, echo, 2007, improved from prior assessment /     ejection fraction 60%, echo, September, 2011  . Tricuspid regurgitation     Mild/moderate,  . Volume overload     As shortness of breath and not taking her diuretics  . Leg pain     Normal Dopplers  . Hoarseness     ENT evaluation March, 2010, no cord dysfunction, treated PPI,  . Zenker's diverticulum     Prior surgery due  . Stress     From taking care of her sister February, 2012       . Atrial fibrillation ([place])   .  COPD (chronic obstructive pulmonary disease) ([place])   . Pulmonary hypertension ([place])   . Osteoarthritis   . Venous stasis   . Renal disorder    Past Surgical History  Procedure Laterality Date  . Eyelid surgery    . Shoulder surgery    . Zenker diverticular repair      x2  . Foot surgery    . Intraocular lens insertion      left eye  . Hemorrhoid surgery    . Inguinal hernia repair      right  . Tonsillectomy     Family History  Problem Relation Age of Onset  . Colon cancer Neg Hx    Social History  Substance Use Topics  . Smoking status: Former Research scientist (life sciences)  . Smokeless tobacco: None  . Alcohol Use: Yes   OB History    No data available     Review of Systems  Constitutional: Negative for fever, chills, diaphoresis and fatigue.  HENT: Negative for congestion,  rhinorrhea and sneezing.   Eyes: Negative.   Respiratory: Positive for cough and shortness of breath. Negative for chest tightness.   Cardiovascular: Negative for chest pain and leg swelling.  Gastrointestinal: Negative for nausea, vomiting, abdominal pain, diarrhea and blood in stool.  Genitourinary: Negative for frequency, hematuria, flank pain and difficulty urinating.  Musculoskeletal: Negative for back pain and arthralgias.  Skin: Negative for rash.  Neurological: Negative for dizziness, speech difficulty, weakness, numbness and headaches.      Allergies  Novocain; Phenergan; and Xarelto  Home Medications   Prior to Admission medications   Medication Sig Start Date End Date Taking? Authorizing Provider  albuterol (PROVENTIL HFA;VENTOLIN HFA) 108 (90 BASE) MCG/ACT inhaler Inhale 2 puffs into the lungs every 6 (six) hours as needed for wheezing or shortness of breath.   Yes Historical Provider, MD  amLODipine-benazepril (LOTREL) 10-20 MG capsule Take 1 capsule by mouth daily.   Yes Historical Provider, MD  atorvastatin (LIPITOR) 10 MG tablet Take 10 mg by mouth daily.   Yes Historical Provider, MD  cycloSPORINE (RESTASIS) 0.05 % ophthalmic emulsion 1 drop 2 (two) times daily.   Yes Historical Provider, MD  eszopiclone (LUNESTA) 1 MG TABS tablet Take 1 mg by mouth at bedtime as needed for sleep. Take immediately before bedtime   Yes Historical Provider, MD  Melatonin 1 MG CAPS Take by mouth.   Yes Historical Provider, MD  pantoprazole (PROTONIX) 40 MG tablet Take 40 mg by mouth daily.   Yes Historical Provider, MD  Ascorbic Acid (VITAMIN C CR) 1000 MG TBCR Take 1 tablet by mouth 3 (three) times daily.      Historical Provider, MD  b complex vitamins tablet Take 1 tablet by mouth daily.      Historical Provider, MD  calcium carbonate, dosed in mg elemental calcium, 1250 MG/5ML 2 tablespoons daily     Historical Provider, MD  carvedilol (COREG) 6.25 MG tablet Take 6.25 mg by mouth 2  (two) times daily with a meal.      Historical Provider, MD  Cholecalciferol (VITAMIN D3) 1000 UNITS tablet 2 tabs daily     Historical Provider, MD  co-enzyme Q-10 30 MG capsule Take by mouth daily. Take once daily     Historical Provider, MD  furosemide (LASIX) 40 MG tablet Take 40 mg by mouth daily.      Historical Provider, MD  Glucosamine-Chondroit-Vit C-Mn (GLUCOSAMINE CHONDROITIN COMPLX) TABS Take by mouth daily.      Historical Provider, MD  Magnesium 400 MG CAPS Take 1 capsule by mouth.      Historical Provider, MD  warfarin (COUMADIN) 5 MG tablet Take by mouth as directed.      Historical Provider, MD  zolpidem (AMBIEN) 5 MG tablet Take 1/4 tablet at bedtime     Historical Provider, MD   Triage Vitals: BP 137/70 mmHg  Pulse 79  Temp(Src) 97.4 F (36.3 C) (Oral)  Resp 20  Ht 5' (1.524 m)  Wt 128 lb (58.06 kg)  BMI 25.00 kg/m2  SpO2 98%  Physical Exam  Constitutional: She is oriented to person, place, and time. She appears well-developed and well-nourished.  HENT:  Head: Normocephalic and atraumatic.  Eyes: Pupils are equal, round, and reactive to light.  Neck: Normal range of motion. Neck supple.  Cardiovascular: Normal rate, regular rhythm and normal heart sounds.   Pulmonary/Chest: Effort normal. No respiratory distress. She has wheezes (few scattered wheezes). She has no rales. She exhibits no tenderness.  Abdominal: Soft. Bowel sounds are normal. There is no tenderness. There is no rebound and no guarding.  Musculoskeletal: Normal range of motion. She exhibits no edema.  Lymphadenopathy:    She has no cervical adenopathy.  Neurological: She is alert and oriented to person, place, and time.  Skin: Skin is warm and dry. No rash noted.  Psychiatric: She has a normal mood and affect.    ED Course  Procedures (including critical care time)  DIAGNOSTIC STUDIES: Oxygen Saturation is 98% on RA, normal by my interpretation.    COORDINATION OF CARE: 6:27 PM- Will order  CXR, BMP, CBC, EKG and oxygen if needed. Pt advised of plan for treatment and pt agrees.    Labs Review Labs Reviewed  BASIC METABOLIC PANEL - Abnormal; Notable for the following:    Glucose, Bld 116 (*)    BUN 27 (*)    GFR calc non Af Amer 50 (*)    GFR calc Af Amer 58 (*)    All other components within normal limits  CBC - Abnormal; Notable for the following:    Hemoglobin 15.1 (*)    HCT 47.1 (*)    All other components within normal limits  BRAIN NATRIURETIC PEPTIDE - Abnormal; Notable for the following:    B Natriuretic Peptide 202.9 (*)    All other components within normal limits  PROTIME-INR - Abnormal; Notable for the following:    Prothrombin Time 23.0 (*)    INR 2.05 (*)    All other components within normal limits  TROPONIN I    Imaging Review Dg Chest 2 View  06/04/2015  CLINICAL DATA:  Acute onset of severe shortness of breath. Initial encounter. EXAM: CHEST  2 VIEW COMPARISON:  Chest radiograph performed 03/24/2010 FINDINGS: The lungs are hyperexpanded, with flattening of the hemidiaphragms, compatible with COPD. Mild peribronchial thickening is noted. Mild left basilar atelectasis or scarring is seen. There is no evidence of focal opacification, pleural effusion or pneumothorax. The heart is borderline enlarged. No acute osseous abnormalities are seen. There is chronic widening of the right acromioclavicular joint, of uncertain significance. IMPRESSION: 1. Mild left basilar atelectasis or scarring noted. Mild peribronchial thickening seen. 2. Findings of COPD. 3. Borderline cardiomegaly. Electronically Signed   By: Garald Balding M.D.   On: 06/04/2015 18:51   I have personally reviewed and evaluated these images and lab results as part of my medical decision-making.   EKG Interpretation   Date/Time:  Sunday June 04 2015 18:51:55 EST Ventricular Rate:  78 PR Interval:    QRS Duration: 92 QT Interval:  414 QTC Calculation: 471 R Axis:   -72 Text  Interpretation:  Atrial fibrillation with premature ventricular or  aberrantly conducted complexes Left axis deviation Anteroseptal infarct ,  age undetermined Abnormal ECG T wave inversion in aVL, otherwise unchanged  from prior EKG Confirmed by Fallen Crisostomo  MD, Mikayah Joy ([id number]) on 06/04/2015  7:10:17 PM      MDM   Final diagnoses:  COPD exacerbation ([place])   Patient presents with shortness of breath. She has not been using her Symbicort inhaler for the last week. She has no associated chest pain or other symptoms that would be more suggestive of ACS. Her troponin is negative. She has no evidence of pneumonia. She has no evidence of pleural effusion. She has no symptoms that we more suggestive of PE. Her INR is therapeutic. She was given nebulizer treatment here and she states that she's not having any shortness of breath currently. She feels better after the nebulizer treatment. She is ambulating around the ED without any shortness of breath. Her oxygen saturations are staying above 94% on room air. I feel her symptoms are most consistent with a flareup of her COPD. I encouraged her to use her Symbicort regularly. She also has a nebulizer machine at home to use. I advised her to follow-up with her physician in [facility] [place] soon as she gets home. She was advised her return to the ED if she has any worsening symptoms.  I personally performed the services described in this documentation, which was scribed in my presence.  The recorded information has been reviewed and considered.     Malvin Johns, MD 06/04/15 2034

## 2015-06-04 NOTE — Discharge Instructions (Signed)
Chronic Obstructive Pulmonary Disease Chronic obstructive pulmonary disease (COPD) is a common lung condition in which airflow from the lungs is limited. COPD is a general term that can be used to describe many different lung problems that limit airflow, including both chronic bronchitis and emphysema. If you have COPD, your lung function will probably never return to normal, but there are measures you can take to improve lung function and make yourself feel better. CAUSES   Smoking (common).  Exposure to secondhand smoke.  Genetic problems.  Chronic inflammatory lung diseases or recurrent infections. SYMPTOMS  Shortness of breath, especially with physical activity.  Deep, persistent (chronic) cough with a large amount of thick mucus.  Wheezing.  Rapid breaths (tachypnea).  Gray or bluish discoloration (cyanosis) of the skin, especially in your fingers, toes, or lips.  Fatigue.  Weight loss.  Frequent infections or episodes when breathing symptoms become much worse (exacerbations).  Chest tightness. DIAGNOSIS Your health care provider will take a medical history and perform a physical examination to diagnose COPD. Additional tests for COPD may include:  Lung (pulmonary) function tests.  Chest X-ray.  CT scan.  Blood tests. TREATMENT  Treatment for COPD may include:  Inhaler and nebulizer medicines. These help manage the symptoms of COPD and make your breathing more comfortable.  Supplemental oxygen. Supplemental oxygen is only helpful if you have a low oxygen level in your blood.  Exercise and physical activity. These are beneficial for nearly all people with COPD.  Lung surgery or transplant.  Nutrition therapy to gain weight, if you are underweight.  Pulmonary rehabilitation. This may involve working with a team of health care providers and specialists, such as respiratory, occupational, and physical therapists. HOME CARE INSTRUCTIONS  Take all medicines  (inhaled or pills) as directed by your health care provider.  Avoid over-the-counter medicines or cough syrups that dry up your airway (such as antihistamines) and slow down the elimination of secretions unless instructed otherwise by your health care provider.  If you are a smoker, the most important thing that you can do is stop smoking. Continuing to smoke will cause further lung damage and breathing trouble. Ask your health care provider for help with quitting smoking. He or she can direct you to community resources or hospitals that provide support.  Avoid exposure to irritants such as smoke, chemicals, and fumes that aggravate your breathing.  Use oxygen therapy and pulmonary rehabilitation if directed by your health care provider. If you require home oxygen therapy, ask your health care provider whether you should purchase a pulse oximeter to measure your oxygen level at home.  Avoid contact with individuals who have a contagious illness.  Avoid extreme temperature and humidity changes.  Eat healthy foods. Eating smaller, more frequent meals and resting before meals may help you maintain your strength.  Stay active, but balance activity with periods of rest. Exercise and physical activity will help you maintain your ability to do things you want to do.  Preventing infection and hospitalization is very important when you have COPD. Make sure to receive all the vaccines your health care provider recommends, especially the pneumococcal and influenza vaccines. Ask your health care provider whether you need a pneumonia vaccine.  Learn and use relaxation techniques to manage stress.  Learn and use controlled breathing techniques as directed by your health care provider. Controlled breathing techniques include:  Pursed lip breathing. Start by breathing in (inhaling) through your nose for 1 second. Then, purse your lips as if you were   going to whistle and breathe out (exhale) through the  pursed lips for 2 seconds.  Diaphragmatic breathing. Start by putting one hand on your abdomen just above your waist. Inhale slowly through your nose. The hand on your abdomen should move out. Then purse your lips and exhale slowly. You should be able to feel the hand on your abdomen moving in as you exhale.  Learn and use controlled coughing to clear mucus from your lungs. Controlled coughing is a series of short, progressive coughs. The steps of controlled coughing are: 1. Lean your head slightly forward. 2. Breathe in deeply using diaphragmatic breathing. 3. Try to hold your breath for 3 seconds. 4. Keep your mouth slightly open while coughing twice. 5. Spit any mucus out into a tissue. 6. Rest and repeat the steps once or twice as needed. SEEK MEDICAL CARE IF:  You are coughing up more mucus than usual.  There is a change in the color or thickness of your mucus.  Your breathing is more labored than usual.  Your breathing is faster than usual. SEEK IMMEDIATE MEDICAL CARE IF:  You have shortness of breath while you are resting.  You have shortness of breath that prevents you from:  Being able to talk.  Performing your usual physical activities.  You have chest pain lasting longer than 5 minutes.  Your skin color is more cyanotic than usual.  You measure low oxygen saturations for longer than 5 minutes with a pulse oximeter. MAKE SURE YOU:  Understand these instructions.  Will watch your condition.  Will get help right away if you are not doing well or get worse.   This information is not intended to replace advice given to you by your health care provider. Make sure you discuss any questions you have with your health care provider.   Document Released: 04/03/2005 Document Revised: 07/15/2014 Document Reviewed: 02/18/2013 Elsevier Interactive Patient Education 2016 [employer]  

## 2016-08-08 DEATH — deceased

## 2016-10-22 IMAGING — DX DG CHEST 2V
2 series · 2 of 2 positions shown · non-contrast
Comparison: Chest radiograph performed 03/24/2010

CLINICAL DATA: Acute onset of severe shortness of breath. Initial
encounter.

EXAM:
CHEST  2 VIEW

[chest pa]
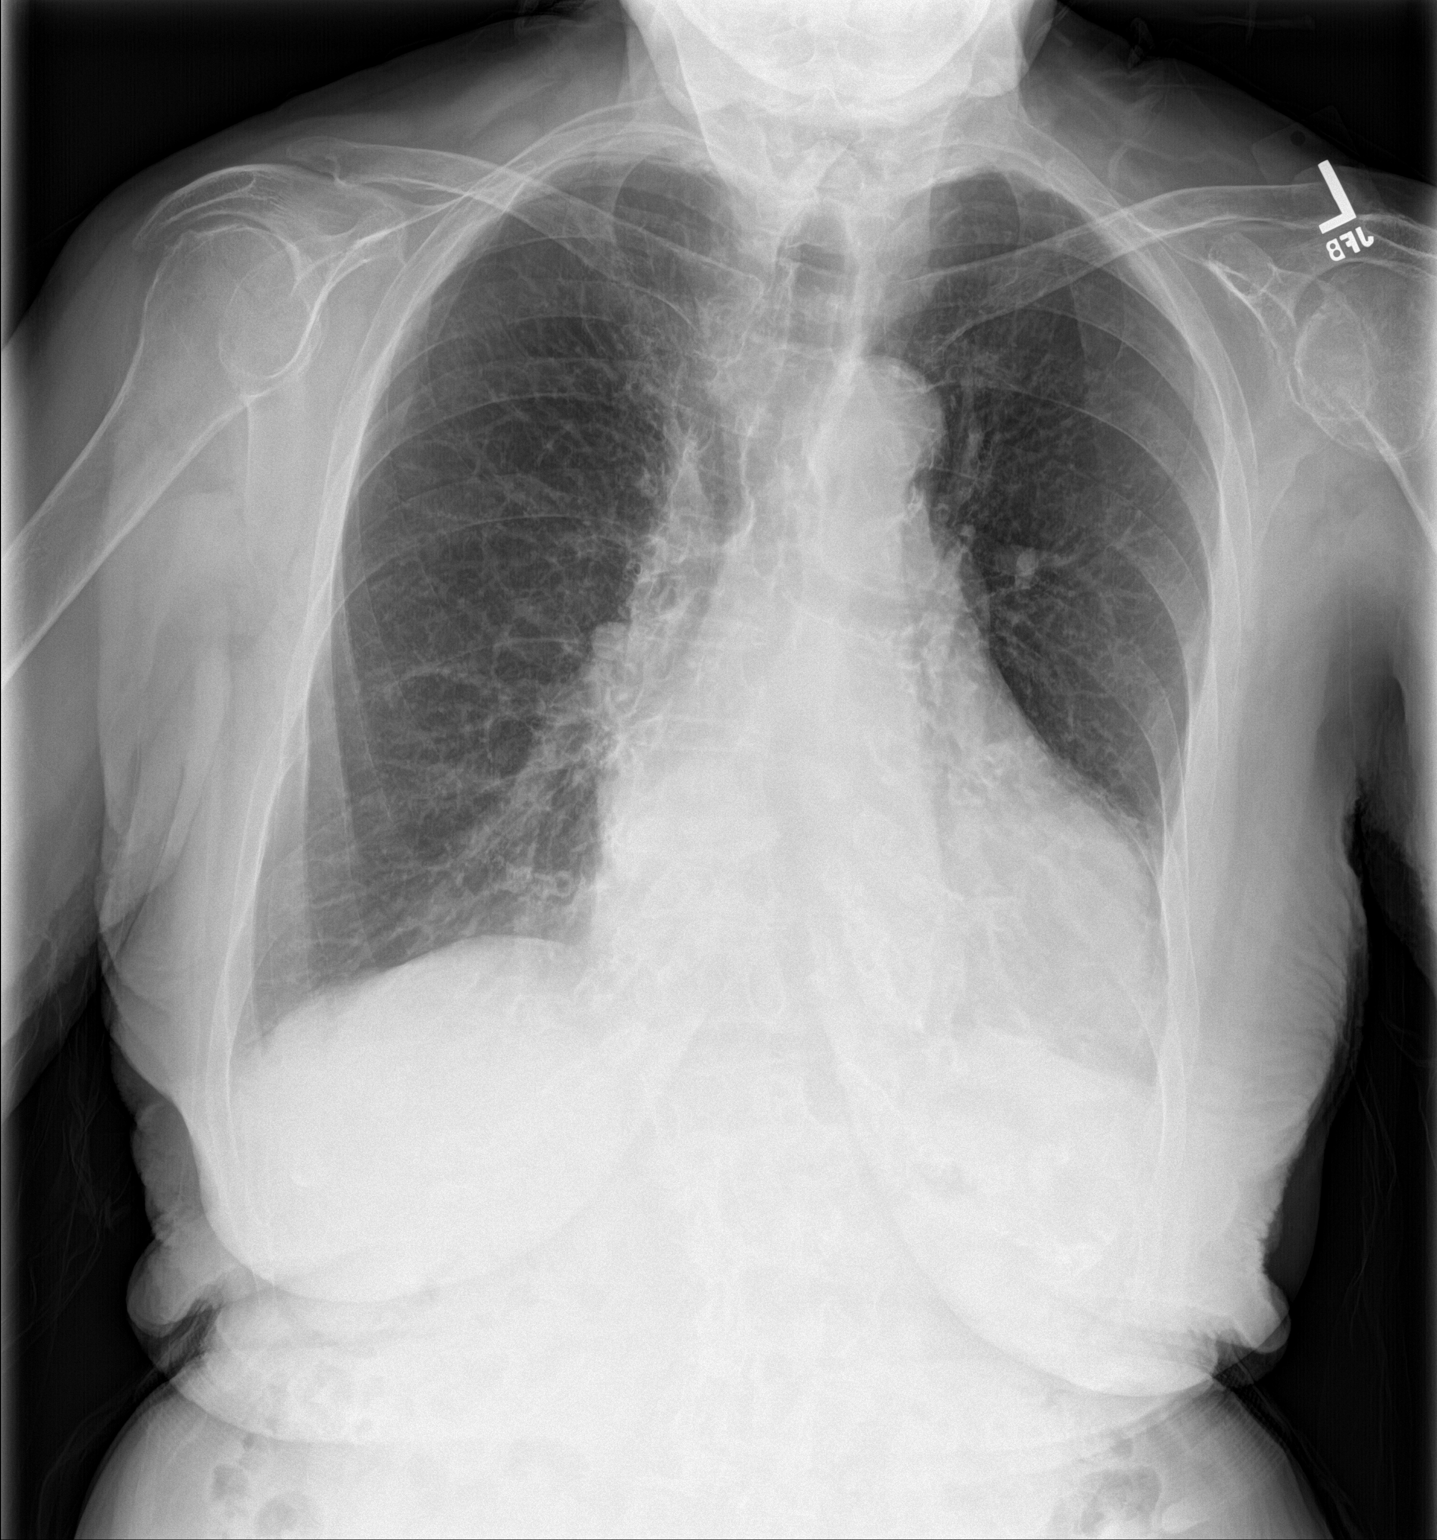

[chest lat]
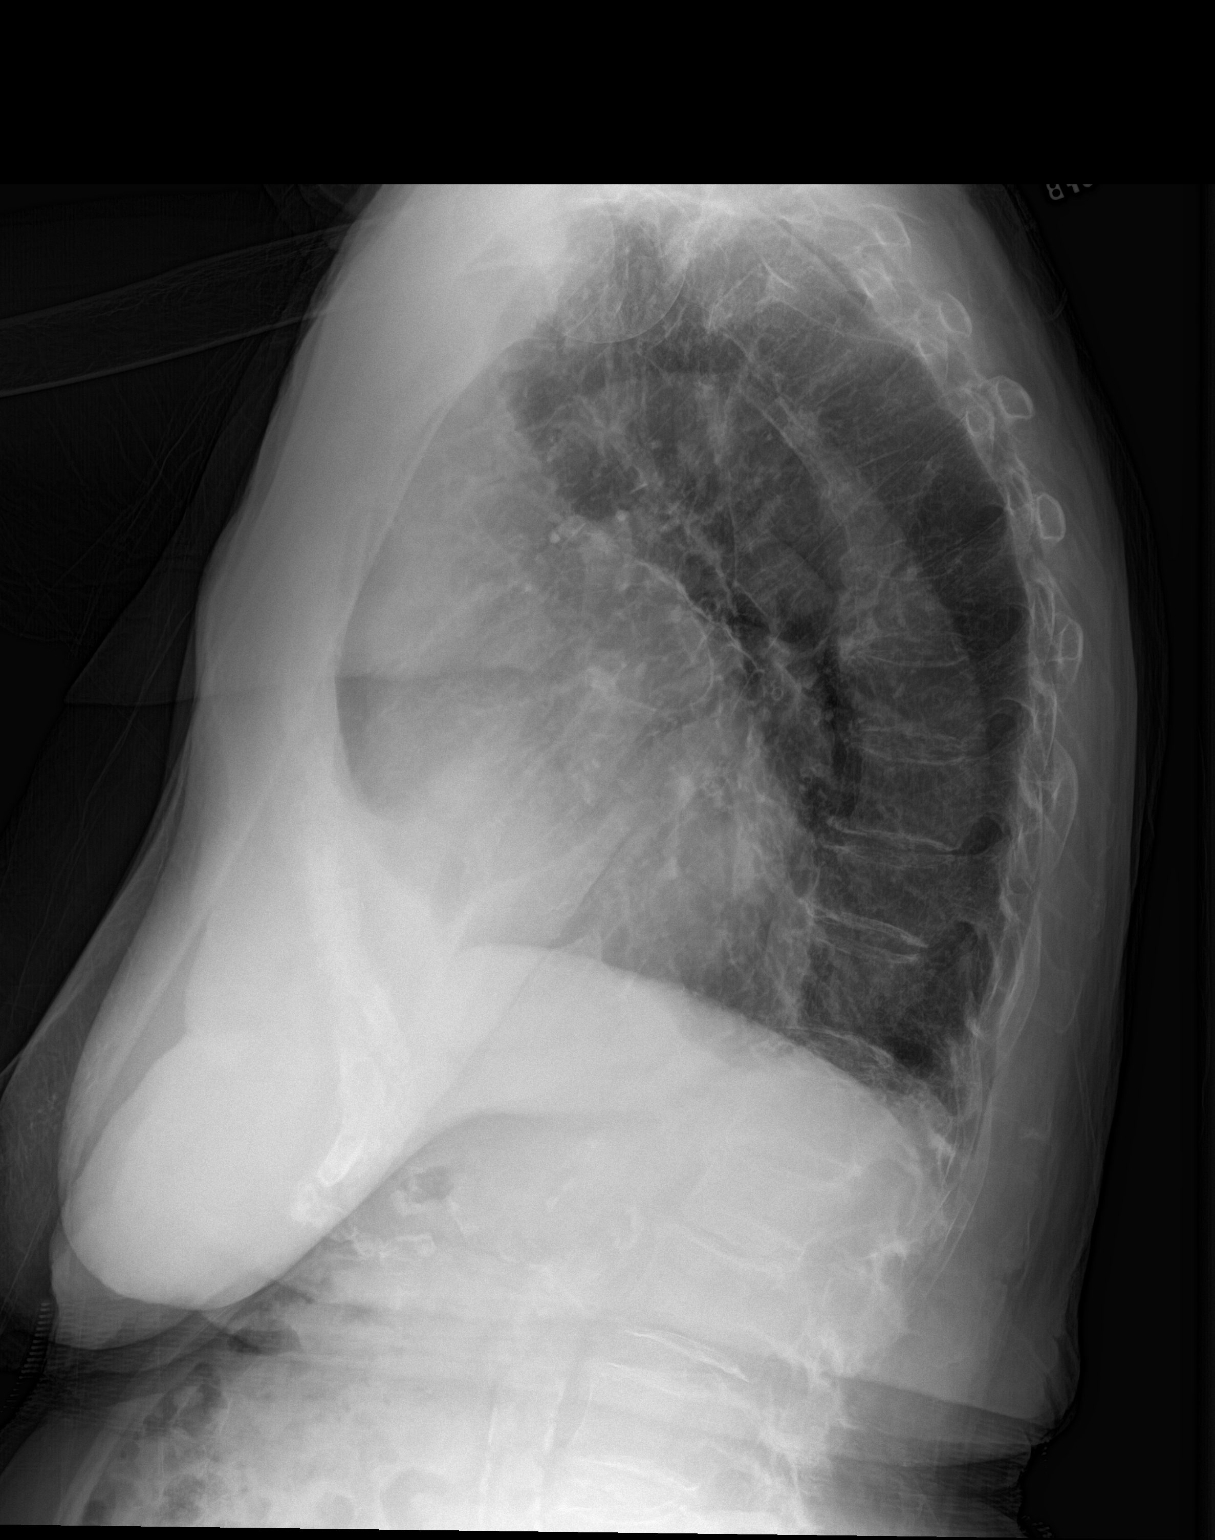

[2 of 2 positions shown; findings below may reference images not displayed]

FINDINGS: The lungs are hyperexpanded, with flattening of the hemidiaphragms,
compatible with COPD. Mild peribronchial thickening is noted. Mild
left basilar atelectasis or scarring is seen. There is no evidence
of focal opacification, pleural effusion or pneumothorax.

The heart is borderline enlarged. No acute osseous abnormalities are
seen. There is chronic widening of the right acromioclavicular
joint, of uncertain significance.
IMPRESSION: 1. Mild left basilar atelectasis or scarring noted. Mild
peribronchial thickening seen.
2. Findings of COPD.
3. Borderline cardiomegaly.
